# Patient Record
Sex: Male | Born: 1970 | Race: White | Hispanic: No | Marital: Married | Smoking: Never smoker
Health system: Southern US, Academic
[De-identification: ages and names within clinical notes are randomized; demographics above are authoritative.]

## PROBLEM LIST (undated history)

## (undated) DIAGNOSIS — I10 Essential (primary) hypertension: Secondary | ICD-10-CM

## (undated) DIAGNOSIS — C801 Malignant (primary) neoplasm, unspecified: Secondary | ICD-10-CM

## (undated) DIAGNOSIS — K219 Gastro-esophageal reflux disease without esophagitis: Secondary | ICD-10-CM

## (undated) DIAGNOSIS — R011 Cardiac murmur, unspecified: Secondary | ICD-10-CM

## (undated) DIAGNOSIS — Z973 Presence of spectacles and contact lenses: Secondary | ICD-10-CM

## (undated) DIAGNOSIS — E785 Hyperlipidemia, unspecified: Secondary | ICD-10-CM

## (undated) HISTORY — PX: SKIN CANCER EXCISION: SHX779

## (undated) HISTORY — PX: GALLBLADDER SURGERY: SHX652

## (undated) HISTORY — PX: HX APPENDECTOMY: SHX54

## (undated) HISTORY — PX: HX UPPER ENDOSCOPY: 2100001144

## (undated) HISTORY — PX: HX WISDOM TEETH EXTRACTION: SHX21

---

## 2021-04-30 ENCOUNTER — Ambulatory Visit (INDEPENDENT_AMBULATORY_CARE_PROVIDER_SITE_OTHER): Payer: Self-pay | Admitting: Foot Surgery

## 2021-05-05 ENCOUNTER — Other Ambulatory Visit (INDEPENDENT_AMBULATORY_CARE_PROVIDER_SITE_OTHER): Payer: 59 | Admitting: Radiology

## 2021-05-05 ENCOUNTER — Ambulatory Visit (INDEPENDENT_AMBULATORY_CARE_PROVIDER_SITE_OTHER): Payer: 59 | Admitting: Foot Surgery

## 2021-05-05 ENCOUNTER — Encounter (INDEPENDENT_AMBULATORY_CARE_PROVIDER_SITE_OTHER): Payer: Self-pay | Admitting: Foot Surgery

## 2021-05-05 ENCOUNTER — Other Ambulatory Visit: Payer: Self-pay

## 2021-05-05 ENCOUNTER — Encounter (INDEPENDENT_AMBULATORY_CARE_PROVIDER_SITE_OTHER): Payer: Self-pay

## 2021-05-05 VITALS — Temp 96.8°F | Ht 72.0 in | Wt 320.1 lb

## 2021-05-05 DIAGNOSIS — S86312A Strain of muscle(s) and tendon(s) of peroneal muscle group at lower leg level, left leg, initial encounter: Secondary | ICD-10-CM

## 2021-05-05 DIAGNOSIS — L600 Ingrowing nail: Secondary | ICD-10-CM

## 2021-05-05 DIAGNOSIS — S86311D Strain of muscle(s) and tendon(s) of peroneal muscle group at lower leg level, right leg, subsequent encounter: Secondary | ICD-10-CM

## 2021-05-05 DIAGNOSIS — M2141 Flat foot [pes planus] (acquired), right foot: Secondary | ICD-10-CM

## 2021-05-05 DIAGNOSIS — M2142 Flat foot [pes planus] (acquired), left foot: Secondary | ICD-10-CM

## 2021-05-05 DIAGNOSIS — L03031 Cellulitis of right toe: Secondary | ICD-10-CM

## 2021-05-05 DIAGNOSIS — M79672 Pain in left foot: Secondary | ICD-10-CM

## 2021-05-05 DIAGNOSIS — G8929 Other chronic pain: Secondary | ICD-10-CM

## 2021-05-05 DIAGNOSIS — M25572 Pain in left ankle and joints of left foot: Secondary | ICD-10-CM

## 2021-05-05 DIAGNOSIS — M7661 Achilles tendinitis, right leg: Secondary | ICD-10-CM

## 2021-05-05 DIAGNOSIS — M7732 Calcaneal spur, left foot: Secondary | ICD-10-CM

## 2021-05-05 DIAGNOSIS — M25571 Pain in right ankle and joints of right foot: Secondary | ICD-10-CM

## 2021-05-05 DIAGNOSIS — M79671 Pain in right foot: Secondary | ICD-10-CM

## 2021-05-05 NOTE — H&P (Cosign Needed)
Eric Cannon   Department of [facility]  Podiatry    Chief Complaint   Patient presents with    Foot Pain           Past Medical History:   History reviewed. No pertinent past medical history.          Family History:  Family Medical History:    None           Social History:     Social History     Tobacco Use    Smoking status: Never     Passive exposure: Never    Smokeless tobacco: Former     Types: Snuff   Substance Use Topics    Alcohol use: Not on file     Comment: 2 drinks in the last 3 months       Medications:    atorvastatin (LIPITOR) 20 mg Oral Tablet, Take 1 Tablet (20 mg total) by mouth Every night  cetirizine HCl (CETIRIZINE ORAL), Take 10 mg by mouth  hydroCHLOROthiazide (HYDRODIURIL) 25 mg Oral Tablet, Take 1 Tablet (25 mg total) by mouth Once a day  omega 3-dha-epa-fish oil 900 mg-360 mg- 455 mg-1,000 mg Oral Capsule, Take by mouth  omeprazole (PRILOSEC) 40 mg Oral Capsule, Delayed Release(E.C.), Take 1 Capsule (40 mg total) by mouth Once a day    No facility-administered medications prior to visit.      Allergies:     Allergies   Allergen Reactions    Asprin Ec Low Dose [Aspirin] Swelling       There is no problem list on file for this patient.      DPM Addition to HPI: Eric Cannon is a 51 y.o. male who presents to clinic for Foot Pain. Has complaint of pain in both ankles. Mentions a knot on the lateral right ankle as well as both ankles rolling easily. Had MRI's done on both ankles in [place]. 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 He wears braces with steel in them for his ankles and power step insoles. Reports he was told he needs surgery for torn ligaments and spurs. Also has complaint of ingrown right hallux nail.  He was told that he had tears and some ligaments.      Physical Exam:  Temp 36 C (96.8 F) (Thermal Scan)    Ht 1.829 m (6')    Wt (!) 145 kg (320 lb 1.6 oz)    BMI 43.41 kg/m       General exam:   Alert orient x3 well dressed well-groomed well-nourished.  Vascular exam:  Palpable DP PT pulses bilaterally, capillary return is less than 5 seconds digits 1 through 5 bilaterally.  Positive hair growth.  Derm exam:  Normal texture turgor color. Incurvated fibular border right great toenail.   Neuro exam:  Light touch sharp dull sensation deep tendon reflexes intact.  Ortho exam:  Pain-free range of motion AJ STJ MPJs muscle test is 5 5 extensor flexor invertor everters. Palpable bony nodule lateral malleolus right foot. Pain to palpation Peroneal tendon bilaterally posterior to malleolus.     Radiographs:  05/05/21 XR FEET WT BEARING BL plantar and posterior calcaneal spur noted on left calcaneus on the lateral views.  There is some cystic changes of the calcaneus on the lateral view on the right foot.  There is also os perineum noted bilaterally.  There is accessory ossicle neck of the  talus on the right.  Pes planus deformity noted bilaterally.  05/05/21 XR ANKLES BL WT BEARING   Similar findings as the foot films are noted    Assessment:    ICD-10-CM    1. Bilateral foot pain  M79.671 XR FEET WT BEARING BILATERAL    M79.672       2. Ingrown nail of great toe of right foot  L60.0 11750 - EXCISION OF NAIL/NAIL MATRIX-PARTIAL/COMPLETE (AMB ONLY-PD)      3. Cellulitis of great toe, right  L03.031       4. Chronic pain of both ankles  M25.571 XR ANKLES BILAT WEIGHT BEARING    G89.29 Refer to Orthopaedics POC    M25.572       5. Tear of peroneal tendon of left foot  S86.312A Refer to Orthopaedics POC      6. Peroneal tendon tear, right, subsequent encounter  S86.311D Refer to [place] of Care:  1. Bilateral radiographs taken of feet and ankles. These were reviewed with the patient.   2. Discussed permanent nail procedure with the patient in great detail.  They are fully aware there is never a 100% guarantee as surgical outcome.  I did advise about possible complications to include but not  limited to; delayed healing, non-healing, infection, recurrence, and loss of nail.  They wished to proceed with the procedure.  1st digit of the right foot was anesthetized with 3 cc of 1% lidocaine with epi 1:100,000 in the for of a digital block.  The digit was prepped in the usual sterile manner.  Digital tourniquet was applied after the digit had been exsanguinated with coban.  The fibular nail border/s were removed en toto 4 applications of phenol were applied to the proximal nail fold/s for 30 secs each.  Site was flushed with alcohol solution.  Dressing was applied using antibiotic ointment, 2x2, 4x4, and 1" coban under slight compression.  Capillary return was noted to the digit.  Patient was given both oral and written post operative instructions.  They tolerated the procedure well and left the clinic with vital stable and neurovascular status intact.  3. Patient will be referred to [place] for further evaluation of ankle pain and peroneal tendon tear.    Follow up:  Return in about 2 weeks (around 05/19/2021) for P & A Nail.  I have asked MCLAIN FREER to follow up in 2 weeks or PRN if symptoms worsen or fail to improve.     I am scribing for, and in the presence of, Dr. Clarise Cruz for services provided on 05/05/2021.  Jovita Kussmaul, SCRIBE   [place], [place]  05/05/2021, 09:47      I personally performed the services described in this documentation, as scribed  in my presence, and it is both accurate  and complete.    Clarise Cruz, DPM    Clarise Cruz, DPM  05/20/2021, 09:53      Orders Placed This Encounter    11750 - EXCISION OF NAIL/NAIL MATRIX-PARTIAL/COMPLETE (AMB ONLY-PD)    XR FEET WT BEARING BILATERAL    XR ANKLES BILAT WEIGHT BEARING

## 2021-05-05 NOTE — Patient Instructions (Signed)
[facility]  [address]  [place], [place] [address]  Podiatry Department: [phone]  Fax: [phone]    Dr. Cherie Kelly-Danhires, DPM  Dr. Kathryn Bosia, DPM    Post-op Nail Instructions for Nail Removal Procedures      Post -Operative Instructions for Nail Removal Procedures    . Leave bandage on and keep dry overnight; it is normal for blood to be on the bandage when it is removed.    . Begin to soak foot in Epsom salts the following day, twice a day for 20 minutes.    . Apply antibiotic ointment and a band aid during the day; allow to air dry at night.    . Expect the toe to become a little red and tender where the nail was removed.  Some drainage will occur which is normal and necessary for healing.    . Watch for signs of infection; pain, redness, swelling, pus, red streaking up you foot and/or leg, fever or chills.  If you should develop any of the above, call the Podiatry Clinic at [phone].    . Your toe will be numb for approximately 4 to 5 hours.  Begin taking extra strength Tylenol or Motrin as directed on manufactuers bottle.    . Resume normal activities as tolerated.    . Follow up with your podiatrist in 2 weeks.

## 2021-05-20 NOTE — Procedures (Signed)
[facility]  [place] [address]  [facility]  Procedure Note    Name: Eric Cannon MRN:  [id number]   Date: 05/05/2021 Age: 51 y.o.     See plan of H&P  Clarise Cruz, DPM  05/20/2021, 09:46

## 2021-05-26 ENCOUNTER — Other Ambulatory Visit: Payer: Self-pay

## 2021-05-26 ENCOUNTER — Ambulatory Visit (INDEPENDENT_AMBULATORY_CARE_PROVIDER_SITE_OTHER): Payer: 59 | Admitting: Foot Surgery

## 2021-05-26 ENCOUNTER — Encounter (INDEPENDENT_AMBULATORY_CARE_PROVIDER_SITE_OTHER): Payer: Self-pay | Admitting: Foot Surgery

## 2021-05-26 VITALS — Temp 97.0°F | Ht 72.0 in | Wt 319.7 lb

## 2021-05-26 DIAGNOSIS — S86312A Strain of muscle(s) and tendon(s) of peroneal muscle group at lower leg level, left leg, initial encounter: Secondary | ICD-10-CM

## 2021-05-26 DIAGNOSIS — L600 Ingrowing nail: Secondary | ICD-10-CM

## 2021-05-26 DIAGNOSIS — M25571 Pain in right ankle and joints of right foot: Secondary | ICD-10-CM

## 2021-05-26 DIAGNOSIS — S86311D Strain of muscle(s) and tendon(s) of peroneal muscle group at lower leg level, right leg, subsequent encounter: Secondary | ICD-10-CM

## 2021-05-26 DIAGNOSIS — G8929 Other chronic pain: Secondary | ICD-10-CM

## 2021-05-26 DIAGNOSIS — M25572 Pain in left ankle and joints of left foot: Secondary | ICD-10-CM

## 2021-05-26 NOTE — Progress Notes (Signed)
[place]   Department of Orthopaedics  [facility]  Podiatry    Subjective:     Patient presents approximately 20 days status post partial phenol matrixectomy of the fibular border/s of the 1st digit right foot.   They deny any cold chills fever night sweats.  They state they have no pain in the toe.  They have been compliant with postoperative treatment.    Objective:  The  digit appears to be healing.  There is some pain to palpation.  There is some periungual erythema.  There are no signs of infection negative Edema negative malodor negative purulent drainage.    Assessment:  Well-healing status post nail procedure with no postop infection.    ICD-10-CM    1. Ingrown nail of great toe of right foot  L60.0       2. Chronic pain of both ankles  M25.571     G89.29     M25.572       3. Tear of peroneal tendon of left foot  S86.312A       4. Peroneal tendon tear, right, subsequent encounter  S86.311D           Plan:  1. Patient will continue with once daily soaks.  They made the bandage off at night to allow this to get air.  This she continue scrubbing area with warm soapy water.  Should continue Band-Aid when in shoe gear as long as there is drainage on the bandage.    2. They will contact us should they have any questions or concerns regarding the healing of this.  Did advise him to allow the scab to form and fall off on its own.  3. Patient has an appointment for his ankle May 16th with Dr. Iris Pert.    Return if symptoms worsen or fail to improve.    I am scribing for, and in the presence of, Dr. Clarise Cruz for services provided on 05/26/2021.  Jovita Kussmaul, SCRIBE   [place], [place]  05/26/2021, 14:42    I personally performed the services described in this documentation, as scribed  in my presence, and it is both accurate  and complete.    Clarise Cruz, [place] Antigone Crowell, [place]  05/26/2021, 15:04

## 2021-07-06 ENCOUNTER — Other Ambulatory Visit: Payer: Self-pay

## 2021-07-06 ENCOUNTER — Ambulatory Visit: Payer: 59 | Attending: Orthopaedic Surgery | Admitting: Orthopaedic Surgery

## 2021-07-06 VITALS — BP 128/82 | HR 81 | Temp 97.3°F | Ht 72.0 in | Wt 320.5 lb

## 2021-07-06 DIAGNOSIS — S86319A Strain of muscle(s) and tendon(s) of peroneal muscle group at lower leg level, unspecified leg, initial encounter: Secondary | ICD-10-CM | POA: Insufficient documentation

## 2021-07-06 DIAGNOSIS — M7670 Peroneal tendinitis, unspecified leg: Secondary | ICD-10-CM | POA: Insufficient documentation

## 2021-07-06 DIAGNOSIS — M7671 Peroneal tendinitis, right leg: Secondary | ICD-10-CM

## 2021-07-06 DIAGNOSIS — M7672 Peroneal tendinitis, left leg: Secondary | ICD-10-CM

## 2021-07-06 DIAGNOSIS — S86311A Strain of muscle(s) and tendon(s) of peroneal muscle group at lower leg level, right leg, initial encounter: Secondary | ICD-10-CM

## 2021-07-06 DIAGNOSIS — G5780 Other specified mononeuropathies of unspecified lower limb: Secondary | ICD-10-CM | POA: Insufficient documentation

## 2021-07-06 DIAGNOSIS — Q663 Other congenital varus deformities of feet, unspecified foot: Secondary | ICD-10-CM | POA: Insufficient documentation

## 2021-07-06 DIAGNOSIS — S86312A Strain of muscle(s) and tendon(s) of peroneal muscle group at lower leg level, left leg, initial encounter: Secondary | ICD-10-CM

## 2021-07-06 NOTE — Progress Notes (Signed)
[facility]  [place], [place] [address]  O: [phone]  F: [phone]       [facility]     PATIENT NAME:  Eric Cannon RECORD NUMBER: [id number]    Chief Complaint      bilateral ankle pain    History of Present Illness      Eric Cannon is a very pleasant 51 y.o. male who presents today as a new patient for bilateral ankle complaints.  The symptoms started over 1 yr ago.  He reports no new injury or accident.  Associated symptoms include pain, weakness and instability.  The symptoms/pain are predominantly arising from the lateral retromalleolar area.  The symptoms/pain are described as dull, more intense with daily activities, with walking and with standing, and are perceived as intermittent. The symptoms are aggravated by weight bearing, standing and walking.  He has tried activity modification, NSAIDS and custom bracing without much relief.  The patient admits to frequent feelings of ankle instability.    He denies any prior relevant foot/ankle surgeries in the past.    He works as a [employer] administrator. He has tried PT, OTC meds, immobilization, bracing, time    Patient describes retromalleolar ankle pain as the most severe spot as well as anterolateral ankle pain. He states he was given [place] braces that help mildly. He cannot walk on uneven surfaces without these.       Past Medical/Surgical History     No past medical history on file.         Past Surgical History:   Procedure Laterality Date   . GALLBLADDER SURGERY     . HX APPENDECTOMY                Past Family and Social History     Family Medical History:    None           Social History     Socioeconomic History   . Marital status: Married     Spouse name: Not on file   . Number of children: Not on file   . Years of education: Not on file   . Highest education level: Not on file   Occupational History    . Not on file   Tobacco Use   . Smoking status: Never     Passive exposure: Never   . Smokeless tobacco: Former     Types: Snuff   Substance and Sexual Activity   . Alcohol use: Not on file     Comment: 2 drinks in the last 3 months   . Drug use: Never   . Sexual activity: Not on file   Other Topics Concern   . Not on file   Social History Narrative   . Not on file     Social Determinants of Health     Financial Resource Strain: Not on file   Transportation Needs: Not on file   Social Connections: Not on file   Intimate Partner Violence: Not on file   Housing Stability: Not on file        Medications and Allergies     Home Medications:  Current Outpatient Medications   Medication Instructions   . atorvastatin (LIPITOR) 20 mg, Oral, NIGHTLY   . cetirizine HCl (CETIRIZINE ORAL) 10 mg, Oral   . glucosam-chon-msm1-C-mang (OSTEO  BI-FLEX TRIPLE STRENGTH) 750 mg-644 mg- 30 mg-1 mg Oral Tablet 2 Tablets, Oral, DAILY   . hydroCHLOROthiazide (HYDRODIURIL) 25 mg, Oral, DAILY   . multivit with minerals/lutein (MULTIVITAMIN 50 PLUS ORAL) Oral   . omega 3-dha-epa-fish oil 900 mg-360 mg- 455 mg-1,000 mg Oral Capsule Oral   . omeprazole (PRILOSEC) 40 mg, Oral, DAILY       Allergies:  Allergies   Allergen Reactions   . Asprin Ec Low Dose [Aspirin] Swelling       Review of Systems     A 14-point comprehensive ROS is obtained from the patient today by my CMA and personally reviewed by myself.  It is documented in a separate note which includes the pertinent positives.  All other systems are negative.    This ROS includes the following systems: Constitutional, HEENT, Eyes, Respiratory, Cardiovascular, GastrointestinaI, Endocrine, Genitourinary, Musculoskeletal, Allergy/Immuno, Neurological, Hematologic, Behavorial, and Skin.      Physical Examination + Relevant Imaging/Labs/Other Studies     Ht:1.829 m (6') Wt:(!) 145 kg (320 lb 8.8 oz) BMI: Body mass index is 43.47 kg/m.    General/Constitutional: no apparent distress,  well-nourished, well-developed  Eyes: pupils are equal, round, with synchronous movement; sclera white  Lymphatic: no palpable adenopathy  Respiratory: non-labored breathing, symmetric rise of chest wall  Vascular: no edema, cyanosis, varicosities, or swelling, except as noted in exam  Integumentary: no impressive skin lesions present; skin has normal pigmentation, color, hair distribution, texture, and temperature except as noted in exam  Neurological:  Oriented to person, place, and time.  Psychological:  Normal mood and affect.  Musculoskeletal: normal, except as noted in exam      Foot/Ankle exam       RIGHT LEFT   GAIT: Normal Normal        STANDING ALIGNMENT:     Ankle/Hindfoot/Midfoot Deformity Varus alignment hindfoot Varus alignment hindfoot   Forefoot Deformity none none        SEATED EXAM:     Skin normal normal   Nails normal- no clubbing or nail changes normal- no clubbing or nail changes   Effusion none none   Swelling none none   Warmth No warmth No warmth   Crepitation none none   Tenderness Ankle - tender over peroneal tendons, ATFL  Foot - none  Toes - none Ankle - tender over peroneal tendons, ATFL  Foot - none  Toes - none        ROM:     Ankle Dorsiflexion 20 degrees, Painless 20 degrees, Painless   Ankle Plantarflexion 40 degrees, Painless 40 degrees, Painless   Subtalar Joint Inversion 30 degrees, Painless 30 degrees, Painless   Subtalar Joint Eversion 15 degrees, painful 15 degrees, painful   Transverse Tarsal Joints Abduction Painless Painless   Transverse Tarsal Joints Adduction Painless Painless        STRENGTH:     Atrophy none none   Abnormal Movements none none   Anterior Tibial Muscle 5/5 5/5   Posterior Tibial Muscle 5/5 5/5   Peroneals 5/5 5/5   Gastroc-soleus Muscle 5/5 5/5   Single Heel Raise Able Able        STABILITY: No instability No instability        SENSATION:     Sural Nerve Dist. Paresthesias  intact   Saphenous New Dist. intact intact   Deep Peroneal Nerve Dist. intact  intact   Superficial Peroneal Nerve Dist. intact intact        VASCULAR EXAM:  Varicosities none none   Capillary refill < 3 seconds/normal < 3 seconds/normal   DP Artery pulse Palpable Palpable   PT Artery pulse Palpable Palpable        Equinus contracture present? no no   Silfverskiold testing is Silfverskiold testing: negative for any significant contracture Silfverskiold testing: negative for any significant contracture          OTHER TESTS/FINDINGS:   RIGHT LEFT   Anterior ankle drawer (in PF): negative negative   Anterior ankle drawer (in neutral DF):  negative negative   Anteromedial (deltoid) ankle drawer test:  negative negative   Tenderness to palpation over ankle syndesmosis?  no no   Anterior ankle impingement test:  Negative Negative   Posterior ankle impingement test:  Negative Negative   Edema/fullness over peroneal tendons?  yes yes   Peroneal tenderness to palpation or with resistive eversion testing?   yes yes   Peroneal subluxation or dislocation with provacative testing?  no no   Gastroc/calf atrophy?  no no   Achilles tenderness/defect? no no   Edema and tenderness over course of posterior tibial tendon?{ no no   Tenderness over posterior tibial tendon with resisted inversion testing? no no   Able to perform double heel rise on affected side?  yes yes   Able to perform single heel rise on affected side?  yes yes   Discomfort with attempted heel rise on affected side?  no no   Tinel's over the tarsal tunnel region?  no no         Imaging   Radiographs:  3V WBof the bilateralfoot and ankle are ordered, obtained, and independently reviewed/interpreted by myself today and reveal some anterior ankle osteophytes. He also has hindfoot varus       Other Studies, Imaging, Lab Testing   MRI of bilateral ankles from outside show bilateral peroneal tendon edema around the tendons sheaths. The right side has a lot of edema around the peroneal tubercle. The reads state bilateral brevis tears with  tendonopathy of the longus tendons      Assessment and Plan     Assessment/Plan   1. Peroneal tendinitis, unspecified laterality    2. Peroneal tendon tear    3. Varus deformity of foot    4. 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    1. Patient has failed multiple months of conservative treatment. His symptoms are severe and is interested in surgery. He is a reasonable surgical candidate  2. Surgery would include debridement of the right side peroneal tendons with either tubularization if the tendon can be repaired. He may end up with an allograft or tenodesis depending on amount of tendon involvement. We would start with the right side only  3. We turn in an OR card today. He will need PCP clearance. He is allergic to ASA so we will need to determine what to put him on afterwards    - He will follow up: for preop H&P (We will do consent today)    Questions and concerns have been invited/addressed and the patient has a good understanding of the diagnosis, treatment plan, goals/expectations, and overall prognosis.      --  Princella Ion, MD  Resident, PGY-5  Department of Orthopaedics  Pager [phone]  07/06/2021, 10:28    I saw and examined the patient.  I directly supervised the resident's activities and procedures.  I  reviewed the resident's note.  I agree with the findings and plan of care as documented in the resident's note.  Radiographs and MRI:  Images reviewed by me on the day of the encounter revealed bilateral peroneal brevis tendinopathy with possible tear of the peroneus brevis and increased edema around the peroneal tubercle on the right.    My findings are consistent with:  Assessment/Plan   1. Peroneal tendinitis, unspecified laterality    2. Peroneal tendon tear    3. Varus deformity of foot    4. Sural neuritis        Assessment and plan:   Eric Cannon is a very pleasant 51 year old male patient presents to clinic today for evaluation  of bilateral ankle pain, worse on the right.  The patient had extensive conservative treatment including physical therapy over the past year without improvement in his symptoms.  The symptoms are worse on the right.  We discussed the diagnosis of peroneal tendon tear, tendinopathy, hindfoot varus.  The patient has some numbness shooting down the lateral aspect of his foot we discussed the diagnosis of sural neuritis.  I went over the conservative treatment options for peroneal tendinitis/tear, I have recommended the use of Voltaren gel.  The patient feels exhausted from doing conservative treatment for over a year with no improvement and worsening of his symptoms.  We discussed the surgical treatment option that includes peroneal tendon exploration, debridement, possible tear, possible allograft replacement and excision of the peroneal tubercle.  We discussed the postoperative recovery protocol.  We also went over the benefits and the risks of the surgery in details.  The patient is allergic to aspirin.  We discussed the risk of DVT after peroneal tendon surgery patient is interested in pursuing surgery.    I personally discussed the diagnosis, alternative treatment options (and the risks/benefits of such), risks and benefits of the above surgical intervention, and all questions/concerns were invited and addressed with the patient/family.  We further reviewed the associated risks and benefits of electing to pursue none of the non-operative or operative treatments.  The patient/family has verbalized a good understanding of the diagnosis, the planned procedure as above, anticipated post-operative course (which has also been documented on my post-operative worksheet that I review with and give to each patient), overall prognosis, benefits to surgery, and risks, that include, but are not limited to: infection, wound healing issues, stiffness, persisting or worsening pain, numbness, nerve and soft tissue injury, surgery  risks: need for further surgery, failure of procedure, complications of anesthesia, stroke, blood clot, major cardiopulmonary event, and loss of limb or life.  The patient wishes to proceed with surgical intervention as described by myself and, as such, both the patient legal guardian: patient and I signed an informed surgical consent.      Arlyss Repress, MD  Assistant Professor-   Foot & Ankle Surgery  Department of [place] [facility]

## 2021-08-03 ENCOUNTER — Encounter (HOSPITAL_BASED_OUTPATIENT_CLINIC_OR_DEPARTMENT_OTHER): Payer: Self-pay

## 2021-08-05 ENCOUNTER — Encounter (INDEPENDENT_AMBULATORY_CARE_PROVIDER_SITE_OTHER): Payer: Self-pay | Admitting: Orthopaedic Surgery

## 2021-08-05 ENCOUNTER — Other Ambulatory Visit: Payer: Self-pay

## 2021-08-05 ENCOUNTER — Inpatient Hospital Stay (HOSPITAL_COMMUNITY)
Admission: RE | Admit: 2021-08-05 | Discharge: 2021-08-05 | Disposition: A | Discharge status: Home / Self Care | Payer: 59 | Source: Ambulatory Visit | Attending: Orthopaedic Surgery | Admitting: Orthopaedic Surgery

## 2021-08-05 ENCOUNTER — Ambulatory Visit: Payer: 59 | Attending: Orthopaedic Surgery | Admitting: Orthopaedic Surgery

## 2021-08-05 ENCOUNTER — Encounter (HOSPITAL_COMMUNITY): Payer: Self-pay

## 2021-08-05 VITALS — BP 142/90 | HR 69 | Temp 97.1°F | Ht 73.03 in | Wt 310.8 lb

## 2021-08-05 DIAGNOSIS — M7671 Peroneal tendinitis, right leg: Secondary | ICD-10-CM

## 2021-08-05 DIAGNOSIS — M7670 Peroneal tendinitis, unspecified leg: Secondary | ICD-10-CM | POA: Insufficient documentation

## 2021-08-05 DIAGNOSIS — Z01818 Encounter for other preprocedural examination: Secondary | ICD-10-CM

## 2021-08-05 HISTORY — DX: Gastro-esophageal reflux disease without esophagitis: K21.9

## 2021-08-05 HISTORY — DX: Presence of spectacles and contact lenses: Z97.3

## 2021-08-05 HISTORY — DX: Malignant (primary) neoplasm, unspecified (CMS HCC): C80.1

## 2021-08-05 HISTORY — DX: Essential (primary) hypertension: I10

## 2021-08-05 HISTORY — DX: Hyperlipidemia, unspecified: E78.5

## 2021-08-05 HISTORY — DX: Cardiac murmur, unspecified: R01.1

## 2021-08-05 LAB — POC BLOOD GLUCOSE (RESULTS): GLUCOSE, POC: 100 mg/dl (ref 70–105)

## 2021-08-05 NOTE — Progress Notes (Signed)
[place]  [place], [place] [id number]  O: [phone]  F: [phone]       [facility]     PATIENT NAME:  Eric Cannon RECORD NUMBER: [id number]    Chief Complaint      bilateral ankle pain    History of Present Illness      Eric Cannon is a very pleasant 51 y.o. male who presents today as a new patient for bilateral ankle complaints.  The symptoms started over 1 yr ago.  He reports no new injury or accident.  Associated symptoms include pain, weakness and instability.  The symptoms/pain are predominantly arising from the lateral retromalleolar area.  The symptoms/pain are described as dull, more intense with daily activities, with walking and with standing, and are perceived as intermittent. The symptoms are aggravated by weight bearing, standing and walking.  He has tried activity modification, NSAIDS and custom bracing without much relief.  The patient admits to frequent feelings of ankle instability.    He denies any prior relevant foot/ankle surgeries in the past.    He works as a Arts administrator. He has tried PT, OTC meds, immobilization, bracing, time    Patient describes retromalleolar ankle pain as the most severe spot as well as anterolateral ankle pain. He states he was given [place] braces that help mildly. He cannot walk on uneven surfaces without these.     08/05/21  Patient presents today for his preop H&P. His symptoms remain the same with pain in the lateral retro-malleolar area. He is very painful over his peroneal tubercle. He wants to proceed forward with surgery. He did obtain clearance from his PCP.      Past Medical/Surgical History     No past medical history on file.         Past Surgical History:   Procedure Laterality Date   . GALLBLADDER SURGERY     . HX APPENDECTOMY                Past Family and Social History     Family Medical  History:    None           Social History     Socioeconomic History   . Marital status: Married     Spouse name: Not on file   . Number of children: Not on file   . Years of education: Not on file   . Highest education level: Not on file   Occupational History   . Not on file   Tobacco Use   . Smoking status: Never     Passive exposure: Never   . Smokeless tobacco: Former     Types: Snuff   Substance and Sexual Activity   . Alcohol use: Not on file     Comment: 2 drinks in the last 3 months   . Drug use: Never   . Sexual activity: Not on file   Other Topics Concern   . Not on file   Social History Narrative   . Not on file     Social Determinants of Health     Financial Resource Strain: Not on file   Transportation Needs: Not on file   Social Connections: Not on file   Intimate Partner Violence: Not on file   Housing Stability: Not on file  Medications and Allergies     Home Medications:  Current Outpatient Medications   Medication Instructions   . atorvastatin (LIPITOR) 20 mg, Oral, NIGHTLY   . cetirizine HCl (CETIRIZINE ORAL) 10 mg, Oral   . glucosam-chon-msm1-C-mang (OSTEO BI-FLEX TRIPLE STRENGTH) 750 mg-644 mg- 30 mg-1 mg Oral Tablet 2 Tablets, Oral, DAILY   . hydroCHLOROthiazide (HYDRODIURIL) 25 mg, Oral, DAILY   . multivit with minerals/lutein (MULTIVITAMIN 50 PLUS ORAL) Oral   . omega 3-dha-epa-fish oil 900 mg-360 mg- 455 mg-1,000 mg Oral Capsule Oral   . omeprazole (PRILOSEC) 40 mg, Oral, DAILY       Allergies:  Allergies   Allergen Reactions   . Asprin Ec Low Dose [Aspirin] Swelling       Review of Systems     A 14-point comprehensive ROS is obtained from the patient today by my CMA and personally reviewed by myself.  It is documented in a separate note which includes the pertinent positives.  All other systems are negative.    This ROS includes the following systems: Constitutional, HEENT, Eyes, Respiratory, Cardiovascular, GastrointestinaI, Endocrine, Genitourinary, Musculoskeletal, Allergy/Immuno,  Neurological, Hematologic, Behavorial, and Skin.      Physical Examination + Relevant Imaging/Labs/Other Studies     Ht:1.855 m (6' 1.03") Wt:(!) 141 kg (310 lb 13.6 oz) BMI: Body mass index is 40.98 kg/m.    General/Constitutional: no apparent distress, well-nourished, well-developed  Eyes: pupils are equal, round, with synchronous movement; sclera white  Lymphatic: no palpable adenopathy  Respiratory: non-labored breathing, symmetric rise of chest wall  Vascular: no edema, cyanosis, varicosities, or swelling, except as noted in exam  Integumentary: no impressive skin lesions present; skin has normal pigmentation, color, hair distribution, texture, and temperature except as noted in exam  Neurological:  Oriented to person, place, and time.  Psychological:  Normal mood and affect.  Musculoskeletal: normal, except as noted in exam      Foot/Ankle exam       RIGHT LEFT   GAIT: Normal Normal        STANDING ALIGNMENT:     Ankle/Hindfoot/Midfoot Deformity Varus alignment hindfoot Varus alignment hindfoot   Forefoot Deformity none none        SEATED EXAM:     Skin normal normal   Nails normal- no clubbing or nail changes normal- no clubbing or nail changes   Effusion none none   Swelling none none   Warmth No warmth No warmth   Crepitation none none   Tenderness Ankle - tender over peroneal tendons, ATFL  Foot - none  Toes - none Ankle - tender over peroneal tendons, ATFL  Foot - none  Toes - none        ROM:     Ankle Dorsiflexion 20 degrees, Painless 20 degrees, Painless   Ankle Plantarflexion 40 degrees, Painless 40 degrees, Painless   Subtalar Joint Inversion 30 degrees, Painless 30 degrees, Painless   Subtalar Joint Eversion 15 degrees, painful 15 degrees, painful   Transverse Tarsal Joints Abduction Painless Painless   Transverse Tarsal Joints Adduction Painless Painless        STRENGTH:     Atrophy none none   Abnormal Movements none none   Anterior Tibial Muscle 5/5 5/5   Posterior Tibial Muscle 5/5 5/5    Peroneals 5/5 5/5   Gastroc-soleus Muscle 5/5 5/5   Single Heel Raise Able Able        STABILITY: No instability No instability        SENSATION:  Sural Nerve Dist. Paresthesias  intact   Saphenous New Dist. intact intact   Deep Peroneal Nerve Dist. intact intact   Superficial Peroneal Nerve Dist. intact intact        VASCULAR EXAM:     Varicosities none none   Capillary refill < 3 seconds/normal < 3 seconds/normal   DP Artery pulse Palpable Palpable   PT Artery pulse Palpable Palpable        Equinus contracture present? no no   Silfverskiold testing is Silfverskiold testing: negative for any significant contracture Silfverskiold testing: negative for any significant contracture          OTHER TESTS/FINDINGS:   RIGHT LEFT   Anterior ankle drawer (in PF): negative negative   Anterior ankle drawer (in neutral DF):  negative negative   Anteromedial (deltoid) ankle drawer test:  negative negative   Tenderness to palpation over ankle syndesmosis?  no no   Anterior ankle impingement test:  Negative Negative   Posterior ankle impingement test:  Negative Negative   Edema/fullness over peroneal tendons?  yes yes   Peroneal tenderness to palpation or with resistive eversion testing?   yes yes   Peroneal subluxation or dislocation with provacative testing?  no no   Gastroc/calf atrophy?  no no   Achilles tenderness/defect? no no   Edema and tenderness over course of posterior tibial tendon?{ no no   Tenderness over posterior tibial tendon with resisted inversion testing? no no   Able to perform double heel rise on affected side?  yes yes   Able to perform single heel rise on affected side?  yes yes   Discomfort with attempted heel rise on affected side?  no no   Tinel's over the tarsal tunnel region?  no no         Imaging   Radiographs:  3V WBof the bilateralfoot and ankle are ordered, obtained, and independently reviewed/interpreted by myself today and reveal some anterior ankle osteophytes. He also has hindfoot varus        Other Studies, Imaging, Lab Testing   MRI of bilateral ankles from outside show bilateral peroneal tendon edema around the tendons sheaths. The right side has a lot of edema around the peroneal tubercle. The reads state bilateral brevis tears with tendonopathy of the longus tendons      Assessment and Plan     Assessment/Plan   1. 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    We consent him today for Right foot peroneal tendon reconstructions vs debridement, possible allograft, excision of peroneal tubercle, possible groove deepening, and all other procedures as indicated    We go over risks and benefits of surgery today: risks not limited to bleeding, infection, damage to nearby structures, and need for more surgery    Patient will be put on Eliquis after surgery due to his ASA allergery    - He will follow up: for surgery    Questions and concerns have been invited/addressed and the patient has a good understanding of the diagnosis, treatment plan, goals/expectations, and overall prognosis.    --  Princella Ion, MD  Resident, PGY-5  Department of Orthopaedics  Pager [phone]  08/05/2021, 13:23    I saw and examined the patient.  I directly supervised the resident's activities and procedures.  I reviewed the resident's note.  I agree with the findings and plan of care as documented  in the resident's note.    My findings are consistent with:  Assessment/Plan   1. Peroneal tendinitis, unspecified laterality        Assessment and plan:   Mr. Shaul is a very pleasant 51 year old male patient presents to clinic today for preoperative assessment.  We again discussed the diagnosis of peroneal tendon tear with prominent and tender peroneal tubercle.  We discussed the surgical intervention in details.  This will include peroneal tendon exploration, possible repair, possible debridement, possible allograft and excision of the  peroneal tubercle.   We discussed the postoperative recovery protocol..  We specifically discussed the varus heel alignment, however this point we will address the peroneal tendons pathology and avoid calcaneal osteotomy. Patient is agreeable to this plan. The patient will be at least 2 weeks non-weight bearing.  Patient is allergic to aspirin.  He will be placed on Eliquis after the surgery.    I personally discussed the diagnosis, alternative treatment options (and the risks/benefits of such), risks and benefits of the above surgical intervention, and all questions/concerns were invited and addressed with the patient/family.  We further reviewed the associated risks and benefits of electing to pursue none of the non-operative or operative treatments.  The patient/family has verbalized a good understanding of the diagnosis, the planned procedure as above, anticipated post-operative course (which has also been documented on my post-operative worksheet that I review with and give to each patient), overall prognosis, benefits to surgery, and risks, that include, but are not limited to: infection, wound healing issues, stiffness, persisting or worsening pain, numbness, nerve and soft tissue injury, surgery risks: need for further surgery, recurrent deformity, failure of procedure, complications of anesthesia, stroke, blood clot, major cardiopulmonary event, and loss of limb or life.  The patient wishes to proceed with surgical intervention as described by myself and, as such, both the patient legal guardian: patient and I signed an informed surgical consent.      Arlyss Repress, MD  Assistant Professor-   Foot & Ankle Surgery  Department of [facility]

## 2021-08-12 ENCOUNTER — Telehealth (INDEPENDENT_AMBULATORY_CARE_PROVIDER_SITE_OTHER): Payer: Self-pay | Admitting: Orthopaedic Surgery

## 2021-08-12 NOTE — Nursing Note (Signed)
Received FMLA form on 08/05/21.  Completed and faxed form back to [employer] @ [phone] on 08/12/21.  A copy will be sent for scanning and a copy will be mailed to the patient.     Ashley Mariner, RN

## 2021-08-16 ENCOUNTER — Encounter (HOSPITAL_BASED_OUTPATIENT_CLINIC_OR_DEPARTMENT_OTHER): Payer: 59 | Admitting: Student in an Organized Health Care Education/Training Program

## 2021-08-16 ENCOUNTER — Other Ambulatory Visit: Payer: Self-pay

## 2021-08-16 ENCOUNTER — Other Ambulatory Visit (HOSPITAL_COMMUNITY): Payer: 59

## 2021-08-16 ENCOUNTER — Ambulatory Visit (HOSPITAL_BASED_OUTPATIENT_CLINIC_OR_DEPARTMENT_OTHER): Payer: 59 | Admitting: ANESTHESIOLOGY

## 2021-08-16 ENCOUNTER — Ambulatory Visit (HOSPITAL_COMMUNITY): Payer: 59 | Admitting: ANESTHESIOLOGY

## 2021-08-16 ENCOUNTER — Encounter (HOSPITAL_COMMUNITY): Payer: Self-pay | Admitting: Orthopaedic Surgery

## 2021-08-16 ENCOUNTER — Inpatient Hospital Stay (HOSPITAL_COMMUNITY): Payer: 59

## 2021-08-16 ENCOUNTER — Encounter (HOSPITAL_COMMUNITY)
Admission: RE | Disposition: A | Discharge status: Home / Self Care | Payer: Self-pay | Source: Ambulatory Visit | Attending: Orthopaedic Surgery

## 2021-08-16 ENCOUNTER — Encounter (HOSPITAL_COMMUNITY): Payer: 59 | Admitting: Student in an Organized Health Care Education/Training Program

## 2021-08-16 ENCOUNTER — Inpatient Hospital Stay
Admission: RE | Admit: 2021-08-16 | Discharge: 2021-08-16 | Disposition: A | Discharge status: Home / Self Care | Payer: 59 | Source: Ambulatory Visit | Attending: Orthopaedic Surgery | Admitting: Orthopaedic Surgery

## 2021-08-16 DIAGNOSIS — M25571 Pain in right ankle and joints of right foot: Secondary | ICD-10-CM

## 2021-08-16 DIAGNOSIS — S86311A Strain of muscle(s) and tendon(s) of peroneal muscle group at lower leg level, right leg, initial encounter: Secondary | ICD-10-CM | POA: Insufficient documentation

## 2021-08-16 DIAGNOSIS — M65871 Other synovitis and tenosynovitis, right ankle and foot: Secondary | ICD-10-CM

## 2021-08-16 DIAGNOSIS — A1809 Other musculoskeletal tuberculosis: Secondary | ICD-10-CM

## 2021-08-16 SURGERY — REPAIR TENDON TENOLYSIS
Anesthesia: Monitor Anesthesia Care | Site: Ankle | Laterality: Right | Wound class: Clean Wound: Uninfected operative wounds in which no inflammation occurred

## 2021-08-16 MED ORDER — HYDROMORPHONE 1 MG/ML INJECTION WRAPPER
0.4000 mg | INJECTION | INTRAMUSCULAR | Status: DC | PRN
Start: 2021-08-16 — End: 2021-08-16

## 2021-08-16 MED ORDER — LACTATED RINGERS INTRAVENOUS SOLUTION
INTRAVENOUS | Status: DC
Start: 2021-08-16 — End: 2021-08-16

## 2021-08-16 MED ORDER — MIDAZOLAM 1 MG/ML INJECTION SOLUTION
2.0000 mg | Freq: Once | INTRAMUSCULAR | Status: DC | PRN
Start: 2021-08-16 — End: 2021-08-16
  Administered 2021-08-16: 2 mg via INTRAVENOUS
  Filled 2021-08-16: qty 2

## 2021-08-16 MED ORDER — HYDROMORPHONE 1 MG/ML INJECTION WRAPPER
0.2000 mg | INJECTION | INTRAMUSCULAR | Status: DC | PRN
Start: 2021-08-16 — End: 2021-08-16

## 2021-08-16 MED ORDER — SODIUM CHLORIDE 0.9% FLUSH BAG - 250 ML
INTRAVENOUS | Status: DC | PRN
Start: 2021-08-16 — End: 2021-08-16

## 2021-08-16 MED ORDER — SUGAMMADEX 100 MG/ML INTRAVENOUS SOLUTION
INTRAVENOUS | Status: AC
Start: 2021-08-16 — End: 2021-08-16
  Filled 2021-08-16: qty 2

## 2021-08-16 MED ORDER — SENNOSIDES 8.6 MG TABLET
8.6000 mg | ORAL_TABLET | Freq: Every evening | ORAL | 0 refills | Status: AC | PRN
Start: 2021-08-16 — End: 2021-08-23
  Filled 2021-08-16: qty 7, 7d supply, fill #0

## 2021-08-16 MED ORDER — FENTANYL (PF) 50 MCG/ML INJECTION SOLUTION
INTRAMUSCULAR | Status: AC
Start: 2021-08-16 — End: 2021-08-16
  Filled 2021-08-16: qty 2

## 2021-08-16 MED ORDER — CEFAZOLIN 10 GRAM SOLUTION FOR INJECTION
3.0000 g | Freq: Once | INTRAMUSCULAR | Status: AC
Start: 2021-08-16 — End: 2021-08-16
  Administered 2021-08-16: 3 g via INTRAVENOUS
  Filled 2021-08-16: qty 30

## 2021-08-16 MED ORDER — SODIUM CHLORIDE 0.9 % (FLUSH) INJECTION SYRINGE
2.0000 mL | INJECTION | INTRAMUSCULAR | Status: DC | PRN
Start: 2021-08-16 — End: 2021-08-16

## 2021-08-16 MED ORDER — DEXAMETHASONE SODIUM PHOSPHATE 4 MG/ML INJECTION SOLUTION
Freq: Once | INTRAMUSCULAR | Status: DC | PRN
Start: 2021-08-16 — End: 2021-08-16
  Administered 2021-08-16: 4 mg via INTRAVENOUS

## 2021-08-16 MED ORDER — BUPIVACAINE (PF) 0.25 % (2.5 MG/ML) INJECTION SOLUTION
30.0000 mL | Freq: Once | INTRAMUSCULAR | Status: DC | PRN
Start: 2021-08-16 — End: 2021-08-16
  Administered 2021-08-16: 40 mL via INTRAMUSCULAR

## 2021-08-16 MED ORDER — ONDANSETRON HCL (PF) 4 MG/2 ML INJECTION SOLUTION
Freq: Once | INTRAMUSCULAR | Status: DC | PRN
Start: 2021-08-16 — End: 2021-08-16
  Administered 2021-08-16: 4 mg via INTRAVENOUS

## 2021-08-16 MED ORDER — SUGAMMADEX 100 MG/ML INTRAVENOUS SOLUTION
Freq: Once | INTRAVENOUS | Status: DC | PRN
Start: 2021-08-16 — End: 2021-08-16
  Administered 2021-08-16: 200 mg via INTRAVENOUS

## 2021-08-16 MED ORDER — SUCCINYLCHOLINE 20 MG/ML INTRAVENOUS WRAPPER
INJECTION | Freq: Once | INTRAVENOUS | Status: DC | PRN
Start: 2021-08-16 — End: 2021-08-16
  Administered 2021-08-16: 140 mg via INTRAVENOUS

## 2021-08-16 MED ORDER — DEXTROSE 5% IN WATER (D5W) FLUSH BAG - 250 ML
INTRAVENOUS | Status: DC | PRN
Start: 2021-08-16 — End: 2021-08-16

## 2021-08-16 MED ORDER — LIDOCAINE (PF) 100 MG/5 ML (2 %) INTRAVENOUS SYRINGE
INJECTION | Freq: Once | INTRAVENOUS | Status: DC | PRN
Start: 2021-08-16 — End: 2021-08-16
  Administered 2021-08-16: 100 mg via INTRAVENOUS

## 2021-08-16 MED ORDER — DEXMEDETOMIDINE 4 MCG/ML IV DILUTION
Freq: Once | INTRAMUSCULAR | Status: DC | PRN
Start: 2021-08-16 — End: 2021-08-16
  Administered 2021-08-16: 10 ug via INTRAVENOUS

## 2021-08-16 MED ORDER — GABAPENTIN 600 MG TABLET
300.0000 mg | ORAL_TABLET | Freq: Every evening | ORAL | 0 refills | Status: AC
Start: 2021-08-16 — End: ?
  Filled 2021-08-16: qty 7, 14d supply, fill #0

## 2021-08-16 MED ORDER — VANCOMYCIN 1,000 MG IV POWDER - FOR OR
1.0000 g | Freq: Once | TOPICAL | Status: AC
Start: 2021-08-16 — End: 2021-08-16
  Administered 2021-08-16: 1 g

## 2021-08-16 MED ORDER — OXYCODONE 5 MG TABLET
5.0000 mg | ORAL_TABLET | Freq: Four times a day (QID) | ORAL | 0 refills | Status: AC | PRN
Start: 2021-08-16 — End: ?
  Filled 2021-08-16: qty 28, 7d supply, fill #0

## 2021-08-16 MED ORDER — ACETAMINOPHEN 500 MG TABLET
500.0000 mg | ORAL_TABLET | Freq: Four times a day (QID) | ORAL | 0 refills | Status: AC | PRN
Start: 2021-08-16 — End: ?
  Filled 2021-08-16: qty 90, 23d supply, fill #0

## 2021-08-16 MED ORDER — FENTANYL (PF) 50 MCG/ML INJECTION SOLUTION
Freq: Once | INTRAMUSCULAR | Status: DC | PRN
Start: 2021-08-16 — End: 2021-08-16
  Administered 2021-08-16 (×2): 25 ug via INTRAVENOUS

## 2021-08-16 MED ORDER — ONDANSETRON HCL 4 MG TABLET
4.0000 mg | ORAL_TABLET | Freq: Three times a day (TID) | ORAL | 0 refills | Status: AC | PRN
Start: 2021-08-16 — End: 2021-08-23
  Filled 2021-08-16: qty 21, 7d supply, fill #0

## 2021-08-16 MED ORDER — CHOLECALCIFEROL (VITAMIN D3) 125 MCG (5,000 UNIT) CAPSULE
5000.0000 [IU] | ORAL_CAPSULE | Freq: Every day | ORAL | 0 refills | Status: AC
Start: 2021-08-16 — End: ?
  Filled 2021-08-16: qty 30, 30d supply, fill #0

## 2021-08-16 MED ORDER — SODIUM CHLORIDE 0.9 % (FLUSH) INJECTION SYRINGE
2.0000 mL | INJECTION | Freq: Three times a day (TID) | INTRAMUSCULAR | Status: DC
Start: 2021-08-16 — End: 2021-08-16

## 2021-08-16 MED ORDER — PROPOFOL 10 MG/ML IV BOLUS
INJECTION | Freq: Once | INTRAVENOUS | Status: DC | PRN
Start: 2021-08-16 — End: 2021-08-16
  Administered 2021-08-16: 100 mg via INTRAVENOUS
  Administered 2021-08-16: 50 mg via INTRAVENOUS

## 2021-08-16 MED ORDER — SODIUM CHLORIDE 0.9 % IRRIGATION SOLUTION
1000.0000 mL | Status: DC | PRN
Start: 2021-08-16 — End: 2021-08-16
  Administered 2021-08-16: 1000 mL

## 2021-08-16 MED ORDER — MIDAZOLAM 1 MG/ML INJECTION SOLUTION
INTRAMUSCULAR | Status: AC
Start: 2021-08-16 — End: 2021-08-16
  Filled 2021-08-16: qty 2

## 2021-08-16 MED ORDER — FENTANYL (PF) 50 MCG/ML INJECTION SOLUTION
100.0000 ug | Freq: Once | INTRAMUSCULAR | Status: DC | PRN
Start: 2021-08-16 — End: 2021-08-16
  Administered 2021-08-16: 50 ug via INTRAVENOUS
  Filled 2021-08-16: qty 2

## 2021-08-16 MED ORDER — ACETAMINOPHEN 1,000 MG/100 ML (10 MG/ML) INTRAVENOUS SOLUTION
Freq: Once | INTRAVENOUS | Status: DC | PRN
Start: 2021-08-16 — End: 2021-08-16
  Administered 2021-08-16: 1000 mg via INTRAVENOUS

## 2021-08-16 MED ORDER — VANCOMYCIN 1,000 MG IV POWDER - FOR OR
1.0000 g | Freq: Once | TOPICAL | Status: DC
Start: 2021-08-16 — End: 2021-08-16

## 2021-08-16 MED ORDER — ELIQUIS 5 MG TABLET
5.0000 mg | ORAL_TABLET | Freq: Two times a day (BID) | ORAL | 0 refills | Status: DC
Start: 2021-08-16 — End: 2021-08-31
  Filled 2021-08-16: qty 56, 28d supply, fill #0

## 2021-08-16 MED ORDER — SODIUM CHLORIDE 0.9 % (FLUSH) INJECTION SYRINGE
20.0000 mL | INJECTION | Freq: Once | INTRAMUSCULAR | Status: DC | PRN
Start: 2021-08-16 — End: 2021-08-16

## 2021-08-16 MED ORDER — ASCORBIC ACID (VITAMIN C) 500 MG TABLET
500.0000 mg | ORAL_TABLET | Freq: Every day | ORAL | 0 refills | Status: AC
Start: 2021-08-16 — End: ?
  Filled 2021-08-16: qty 30, 30d supply, fill #0

## 2021-08-16 MED ORDER — ROCURONIUM 10 MG/ML INTRAVENOUS SYRINGE WRAPPER
INJECTION | Freq: Once | INTRAVENOUS | Status: DC | PRN
Start: 2021-08-16 — End: 2021-08-16
  Administered 2021-08-16: 50 mg via INTRAVENOUS

## 2021-08-16 SURGICAL SUPPLY — 70 items
APPL 70% ISPRP 2% CHG 26ML 13._2X13.2IN CHLRPRP PREP DEHP-FR (MED SURG SUPPLIES) ×3
APPL 70% ISPRP 2% CHG 26ML CHLRPRP HI-LT ORNG PREP STRL LF  DISP CLR (MED SURG SUPPLIES) ×2 IMPLANT
BANDAGE 3M COBAN 5.1YDX4IN 2.9_YDX4IN 2 LYR THRP FULL STRCH (WOUND CARE/ENTEROSTOMAL SUPPLY) ×3
BANDAGE 4.1YDX4.5IN 6 PLY HYPOALL COTTON LRG GAUZE WHT STRL LF  DISP (WOUND CARE SUPPLY) ×2 IMPLANT
BANDAGE 4.1YDX4.5IN 6 PLY HYPO_ALL COTTON LRG GAUZE WHT STRL (WOUND CARE/ENTEROSTOMAL SUPPLY) ×3
BANDAGE 5.5YDX4IN NONST ELAS KNIT 2 SLFCLS COTTON COMPRESS (WOUND CARE SUPPLY) IMPLANT
BANDAGE 5.5YDX6IN NONST ELAS KNIT 2 SLFCLS COTTON COMPRESS (WOUND CARE SUPPLY) IMPLANT
BANDAGE COFLX NL 5YDX4IN EASYTEAR CHSV CNTRL STRNG SFT (WOUND CARE/ENTEROSTOMAL SUPPLY) ×3
BANDAGE COFLX NL 5YDX4IN STRL CHSV SFT FOAM COMPRESS TAN LF (WOUND CARE SUPPLY) ×2 IMPLANT
BANDAGE ESMARK 12FTX4IN STRL ELAS COMPRESS BLU LF (WOUND CARE SUPPLY) ×2 IMPLANT
BANDAGE ESMARK 12FTX4IN STRL E_LAS COMPRESS BLU LF (WOUND CARE/ENTEROSTOMAL SUPPLY) ×3
BANDAGE ESMARK 12FTX4IN STRL SYN COMPRESS LF (WOUND CARE SUPPLY) ×2 IMPLANT
BANDAGE ESMARK 12FTX4IN STRL S_YN COMPRESS LF (WOUND CARE/ENTEROSTOMAL SUPPLY) ×3
BLADE SAW .98X.36IN OSC SGTL T_HK.015IN MICRO PREC TPS (CUTTING ELEMENTS)
BLADE SAW 11MM 40MM LAPIPLASTY (CUTTING ELEMENTS)
BLADE SAW 11MM 40MM LAPIPLASTY (SURGICAL CUTTING SUPPLIES) IMPLANT
BLADE SAW 18.5X7MM OSC SGTL SS_THK.38MM MED NRW THN PREC 35 (CUTTING ELEMENTS)
BLADE SAW 18.5X7MM OSCILLATE SGTL SS THK.38MM MED NRW THN PREC STRL LF  DISP (SURGICAL CUTTING SUPPLIES) IMPLANT
BLADE SAW 25X9MM OSCILLATE SGTL AGRS OFST SS THK.38MM MED THN PREC STRL LF  DISP (SURGICAL CUTTING SUPPLIES) ×2 IMPLANT
BLADE SAW 25X9MM RECIPROCATE THK.38MM THN PREC (SURGICAL CUTTING SUPPLIES) IMPLANT
BLADE SAW 31X9MM OSCILLATE SGT_L SS LONG MED PREC STRL (CUTTING ELEMENTS)
BLADE SAW 9MM OSCILLATE SGTL A_GRS THN SS THK.38MM 25MM MED (CUTTING ELEMENTS) ×3
BLADE SAW 9MM OSCILLATE SGTL SS THK.51MM 31MM MED LONG PREC STRL LF  DISP (SURGICAL CUTTING SUPPLIES) IMPLANT
BLANKET MISTRAL-AIR ADULT UPR BODY 79X29.9IN FRC AIR HI VOL BLWR INTUITIVE CONTROL PNL LRG LED (MED SURG SUPPLIES) ×2 IMPLANT
BLANKET MISTRAL-AIR UPR BODY 7_8.7X29.9IN FRC AIR WARM (MED SURG SUPPLIES) ×3
COMPRESS COBAN 5.1YDX4IN 2.9YDX4IN 2 LYR FULL STRCH HYPOALL SYSTEM LF  TAN (WOUND CARE/ENTEROSTOMAL SUPPLY) ×2
CONV USE 405188 - CUFF TOURNIQUET BRN 34X4IN ATS 3K CYL 2 PORT 1 BLADDER POS LOCK CONN SLEEVE STRL LF  DISP (MED SURG SUPPLIES) IMPLANT
CONV USE ITEM 338663 - PACK SURG CUSTOM EXTREMITY NONST DISP LF (CUSTOM TRAYS & PACK) ×2 IMPLANT
CONV USE ITEM 343165 - COMPRESS COBAN 5.1YDX4IN 2.9YDX4IN 2 LYR FULL STRCH HYPOALL SYSTEM LF  TAN (WOUND CARE SUPPLY) ×2 IMPLANT
CUFF TOURNIQUET BRN 34X4IN ATS 3K CYL 2 PORT 1 BLADDER POS LOCK CONN SLEEVE STRL LF  DISP (MED SURG SUPPLIES)
DEVICE DRUG DEL 20MM STRL LF (MED SURG SUPPLIES) ×3 IMPLANT
DISC USE ITEM 307130 - GOWN SURG XL XLNG AAMI L4 IMPR_V BRTHBL HKLP CLSR STRL LF (DRAPE/PACKS/SHEETS/OR TOWEL) ×2
DISCONTINUED USE ITEM 307130 - GOWN SURG XL XLNG AAMI L4 IMPR_V BRTHBL HKLP CLSR STRL LF (DRAPE/PACKS/SHEETS/OR TOWEL) ×2
DISCONTINUED USE ITEM 342873 - GOWN SURG XL XLNG AAMI L4 IMPR_V BRTHBL HKLP CLSR STRL LF (DRAPE/PACKS/SHEETS/OR TOWEL) ×2 IMPLANT
DRAPE ADH 51X47IN STRDRP LF  STRL DISP SURG CLR (DRAPE/PACKS/SHEETS/OR TOWEL) ×2 IMPLANT
DRAPE ADH 51X47IN U STRDRP LF_STRL DISP SURG PLASTIC CLR (DRAPE/PACKS/SHEETS/OR TOWEL) ×3
DRAPE CARM FLRSCP EXPD CLPSBL C-ARMOR STRL EQP (DRAPE/PACKS/SHEETS/OR TOWEL) ×2 IMPLANT
DRAPE CARM FLRSCP EXPD CLPSBL_C-ARMOR STRL EQP (DRAPE/PACKS/SHEETS/OR TOWEL) ×3
DRAPE CARM POLY STRAP MBL XRY 72X42IN LF  EQP (DRAPE/PACKS/SHEETS/OR TOWEL) ×2 IMPLANT
DRAPE CARM POLY STRAP MBL XRY_72X42IN LF EQP (DRAPE/PACKS/SHEETS/OR TOWEL) ×3
DRAPE FLRSCN CARM STRAP 85X54IN MINI KOVER LF  STRL EQP POLY (DRAPE/PACKS/SHEETS/OR TOWEL) IMPLANT
DRAPE FLRSCN CARM STRAP 85X54I_N MINI KOVER LF STRL EQP POLY (DRAPE/PACKS/SHEETS/OR TOWEL)
DRAPE IMPRV SPLT ADH 76X54IN LF  STRL DISP SURG SMS 24X6IN (DRAPE/PACKS/SHEETS/OR TOWEL) ×4 IMPLANT
DRAPE IMPRV SPLT ADH 76X54IN L_F STRL DISP SURG SMS 24X6IN (DRAPE/PACKS/SHEETS/OR TOWEL) ×6
DRESS PETRO 8X1IN CURAD XR COTTON GAUZE NADH OCL IMPREGNATE LF  STRL WHT (WOUND CARE SUPPLY) IMPLANT
DRESS PETRO 8X1IN CURAD XR COT_TON GAUZE NADH OCL IMPREGNATE (WOUND CARE/ENTEROSTOMAL SUPPLY)
DRESS WOUND 4X4IN JUMPSTART ANTIMIC ORDER IN MULTIPLES OF 10 EACH (WOUND CARE SUPPLY) IMPLANT
DRESS WOUND 5X4IN MPTL AG SAFETAC NONADH ANTIMIC LF  STRL (WOUND CARE SUPPLY) IMPLANT
DRESSING JUMPSTART 4X4 (WOUND CARE/ENTEROSTOMAL SUPPLY)
DRESSING WOUND SILVER 4X5 (WOUND CARE/ENTEROSTOMAL SUPPLY)
GARMENT COMPRESS MED CALF CENTAURA NYL VASOGRAD LTWT BRTHBL SEQ FIL BLU 18- IN (MED SURG SUPPLIES) ×2 IMPLANT
GARMENT COMPRESS MED CALF CENT_AURA NYL VASOGRAD LTWT BRTHBL (MED SURG SUPPLIES) ×3
GOWN SURG XL XLNG AAMI L4 IMPR_V BRTHBL HKLP CLSR STRL LF (DRAPE/PACKS/SHEETS/OR TOWEL) ×1
KIT INSTR BIOABS BIOTENOD DISP STRL (ORTHOPEDICS (NOT IMPLANTS)) IMPLANT
KIT INSTR BIOABS BIOTENOD DISP_STRL (ORTHOPEDICS (NOT IMPLANTS))
MBO USE ITEM 133984 - BANDAGE 5.5YDX4IN NONST ELAS KNIT 2 SLFCLS COTTON COMPRESS (WOUND CARE/ENTEROSTOMAL SUPPLY)
MBO USE ITEM 150865 OR 133985 - BANDAGE 5.5YDX6IN NONST ELAS KNIT 2 SLFCLS COTTON COMPRESS (WOUND CARE/ENTEROSTOMAL SUPPLY)
PACK CUSTOM EXTREMITY (CUSTOM TRAYS & PACK) ×3
PACK SURG CSTM EXTREMITY NONST DISP LF (CUSTOM TRAYS & PACK) ×2
PAD ABDOMINAL 10X8IN LF  STRL DISP (WOUND CARE SUPPLY) ×8 IMPLANT
PAD ABDOMINAL 8X10 STRL (WOUND CARE/ENTEROSTOMAL SUPPLY) ×12
RETRIEVER SUT 10.1IN HWSN DRIL_L GUIDE LGMNT STRL LF (SUTURE AIDS)
RETRIEVER SUT 10.1IN HWSN LGMNT DRILL GUIDE STRL LF (SUTURE AIDS) IMPLANT
SPONGE GAUZE 4X4IN MDCHC COTTON 12 PLY TY 7 LF  STRL DISP (WOUND CARE SUPPLY) ×2 IMPLANT
SPONGE GAUZE 4X4IN MDCHC COTTO_N 12 PLY TY 7 LF STRL DISP (WOUND CARE/ENTEROSTOMAL SUPPLY) ×3
STAPLER SKIN 35 CNT REG STPL CART ERG HNDL REM LF  APPOSE DISP CLR SS PLASTIC (SUTURE/WOUND CLOSURE) IMPLANT
STAPLER SKIN REG STRL 35 COUNT_8886803512 12EA/BX (SUTURE/WOUND CLOSURE)
STKNT ORTHO 72X6IN COTTON ALBHL 1 PLY PCUT LF  TUB STRL NATURAL (ORTHOPEDICS (NOT IMPLANTS)) ×2 IMPLANT
STOCKINETTE ORTHO 6X6IN PRECUT_COTTON STRL 7666 (ORTHOPEDICS (NOT IMPLANTS)) ×3
TOURNIQUET PORT SNGL CUFF 34IN_DISP 60707015600 (MED SURG SUPPLIES)

## 2021-08-16 NOTE — Anesthesia Preprocedure Evaluation (Signed)
ANESTHESIA PRE-OP EVALUATION  Eric Cannon  Planned Procedure: REPAIR COLLATERAL LIGAMENT ANKLE (Right)  REPAIR TENDON TENOLYSIS (Right)  REPAIR TENDON LEG (Right)  OSTEOTOMY FIBULA (Right: Ankle)  APPLICATION SPLINT (Right)  Review of Systems     anesthesia history negative     patient summary reviewed          Pulmonary  negative pulmonary ROS,    Cardiovascular    Hypertension and hyperlipidemia ,No peripheral edema,        GI/Hepatic/Renal    GERD and well controlled        Endo/Other   neg endo/other ROS,       Neuro/Psych/MS   negative neuro/psych ROS,      Cancer                      Physical Assessment      Airway       Mallampati: I    TM distance: >3 FB    Neck ROM: full  Mouth Opening: good.  Facial hair  Beard  No endotracheal tube present  No Tracheostomy present    Dental       Dentition intact             Pulmonary    Breath sounds clear to auscultation  (-) no rhonchi, no decreased breath sounds, no wheezes, no rales and no stridor     Cardiovascular    Rhythm: regular  Rate: Normal  (-) no friction rub, carotid bruit is not present, no peripheral edema and no murmur     Other findings            Plan  ASA 3     Planned anesthesia type: MAC               Additional Plans: Pre-op Block    PONV/POV Plan:  I plan to administer pharmcologic prophalaxis antiemetics  Intravenous induction     Anesthesia issues/risks discussed are: PONV, Dental Injuries, Stroke, Sore Throat, Cardiac Events/MI, Eye /Visual Loss, Blood Loss, Aspiration, Intraoperative Awareness/ Recall, Nerve Injuries, Failure of Block and Local Anesthetic Systemic Toxicity.  Anesthetic plan and risks discussed with patient  Signed consent obtained        Use of blood products discussed with patient who consented to blood products.     Patient's NPO status is appropriate for Anesthesia.           Plan discussed with attending.                 Serena Colonel, MD 08/16/2021, 10:49   [facility] Anesthesiology

## 2021-08-16 NOTE — Anesthesia Preprocedure Evaluation (Signed)
ANESTHESIA PRE-OP EVALUATION  Eric Cannon  Planned Procedure: ANES - PAIN BLOCK  Review of Systems     anesthesia history negative     patient summary reviewed          Pulmonary  negative pulmonary ROS,    Cardiovascular    Hypertension and hyperlipidemia ,No peripheral edema,        GI/Hepatic/Renal    GERD and well controlled        Endo/Other   neg endo/other ROS,       Neuro/Psych/MS   negative neuro/psych ROS,      Cancer                      Physical Assessment      Airway       Mallampati: I    TM distance: >3 FB    Neck ROM: full  Mouth Opening: good.  Facial hair  Beard  No endotracheal tube present  No Tracheostomy present    Dental       Dentition intact             Pulmonary    Breath sounds clear to auscultation  (-) no rhonchi, no decreased breath sounds, no wheezes, no rales and no stridor     Cardiovascular    Rhythm: regular  Rate: Normal  (-) no friction rub, carotid bruit is not present, no peripheral edema and no murmur     Other findings            Plan  ASA 3     Planned anesthesia type: general and general endotracheal     general anesthesia with endotracheal tube intubation            Additional Plans: Pre-op Block    PONV/POV Plan:  I plan to administer pharmcologic prophalaxis antiemetics  Intravenous induction     Anesthesia issues/risks discussed are: PONV, Dental Injuries, Stroke, Sore Throat, Cardiac Events/MI, Eye /Visual Loss, Blood Loss, Aspiration, Intraoperative Awareness/ Recall, Nerve Injuries, Failure of Block and Local Anesthetic Systemic Toxicity.  Anesthetic plan and risks discussed with patient  Signed consent obtained        Use of blood products discussed with patient who consented to blood products.     Patient's NPO status is appropriate for Anesthesia.           Plan discussed with attending.                 Serena Colonel, MD 08/16/2021, 10:49   [facility] Anesthesiology

## 2021-08-16 NOTE — Nurses Notes (Signed)
Patient adequate for discharge. Tolerating PO fluids. Dressing clean, dry, and intact. Patient and spouse educated on discharge instructions. All questions encouraged and answered. No further questions by patient and spouse. IV removed. Patient transported to car via wheelchair.

## 2021-08-16 NOTE — Discharge Instructions (Signed)
SURGICAL DISCHARGE INSTRUCTIONS     Dr. Tera Helper, MD  performed your REPAIR TENDON TENOLYSIS, REPAIR TENDON LEG, APPLICATION SPLINT today at the [facility]:  Monday through Friday from 6 a.m. - 7 p.m.: [phone]  Between 7 p.m. - 6 a.m., weekends and holidays:  Call [facility] at [phone] or [phone].    PLEASE SEE WRITTEN HANDOUTS AS DISCUSSED BY YOUR NURSE:      SIGNS AND SYMPTOMS OF A WOUND / INCISION INFECTION   Be sure to watch for the following:  Increase in redness or red streaks near or around the wound or incision.  Increase in pain that is intense or severe and cannot be relieved by the pain medication that your doctor has given you.  Increase in swelling that cannot be relieved by elevation of a body part, or by applying ice, if permitted.  Increase in drainage, or if yellow / green in color and smells bad. This could be on a dressing or a cast.  Increase in fever for longer than 24 hours, or an increase that is higher than 101 degrees Fahrenheit (normal body temperature is 98 degrees Fahrenheit). The incision may feel warm to the touch.    **CALL YOUR DOCTOR IF ONE OR MORE OF THESE SIGNS / SYMPTOMS SHOULD OCCUR.    ANESTHESIA INFORMATION   ANESTHESIA -- ADULT PATIENTS:  You have received intravenous sedation / general anesthesia, and you may feel drowsy and light-headed for several hours. You may even experience some forgetfulness of the procedure. DO NOT DRIVE A MOTOR VEHICLE or perform any activity requiring complete alertness or coordination until you feel fully awake in about 24-48 hours. Do not drink alcoholic beverages for at least 24 hours. Do not stay alone, you must have a responsible adult available to be with you. You may also experience a dry mouth or nausea for 24 hours. This is a normal side effect and will disappear as the effects of the medication wear off.    REMEMBER   If you experience any difficulty breathing, chest pain,  bleeding that you feel is excessive, persistent nausea or vomiting or for any other concerns:  Call your physician  at [phone] or [phone]. You may also ask to have the  doctor on call paged. They are available to you 24 hours a day.    SPECIAL INSTRUCTIONS / COMMENTS   See handout    FOLLOW-UP APPOINTMENTS   Please call patient services at [phone] or [phone] to schedule a date / time of return. They are open Monday - Friday from 7:30 am - 5:00 pm.

## 2021-08-16 NOTE — Anesthesia Procedure Notes (Addendum)
Eric Cannon    Block: Peripheral Block    Performed By:   Authorizing Provider:  Collins Scotland, MD  Performing Provider:  Serena Colonel, MD  I was present and supervised/observed the entire procedure.  Briana Newman, MD 08/16/2021, 10:57   Sedation  The patient was continuously monitored throughout the procedure and in recovery. I was in attendance and supervised the sedation (during the start and stop times listed below) and remained immediately available until the patient returned to pre-procedure baseline.  [name], MD 08/16/2021, 10:57  Sedation Start Time  08/16/2021 10:20 AM   Sedation Stop Time 08/16/2021 10:29 AM  Blocks  Right saphenous  Type of Block: single shot  Ultrasound used for needle placement, imaging, supervision, and interpretation  Image:  images available in PACS  Diagnosis: ankle pain   Indication: Requested by surgeon and Acute post operative pain   Pt location: at Bedside  Requesting Surgeon: Tera Helper, MD  Site verified, H&P updated and consent obtained, Patient monitors applied, Timeout performed, Emergency drugs and equipment available, Patient positioned and anesthesia consent given  Technique(See MAR for doses)       Preprocedure hand washing was performed sterile field maintained      Sterile Skin Prep : Aseptic technique, Sterile gloves, Supplies assembled on sterile field, Mask, cap, Sterile technique, Hand hygiene performed, Sterile field established and Sterilely prepped and draped    Skin prepped with: Chlorhexidine gluconate and isopropyl alcohol    Skin Local      Needle  Needle type: Stimuplex     Needle Gauge: 21G   Needle length: 4 in.  Needle localization: anatomical landmarks and ultrasound guidance  Number of attempts: 1      Catheter          Site      Medications    Assessment  Injection assessment: negative aspiration for heme and local visualized surrounding nerve on ultrasound  Paresthesia pain: none    Heart Rate changed: No    Events     Patient tolerance of  procedure: tolerated well, no immediate complications        NOTES: Continuous ultrasound was used to guide the needle tip in close proximity of the neural target.  There was minimal disruption of musculoskeletal and vascular structures with exceptions as noted. An in-plane long-axis image was saved to the patient's medical record.  Appropriate hydrodissection was achieved and no intraneural injection was appreciated/occurred.    CPT Code 78676    Serena Colonel, MD 08/16/2021   [facility]  [facility] Anesthesiology Department  [phone]

## 2021-08-16 NOTE — H&P (Signed)
[facility]                                                     H&P Update Form    Eric, Cannon, 51 y.o. male  Date of Admission:  08/16/2021  Date of Birth:  1970/05/06    08/16/2021    Pre-Surgical H & P updated the day of the procedure.  1.  H&P completed within 30 days of surgical procedure was performed by Dr. Iris Pert on 08/05/21 and has been reviewed, the patient has been examined, and no change has occured in the patients condition since the H&P was completed.     Comments: No medication changes, denies cp/sob/nvd/fevers/chills, surgical site marked, consent in chart.    2.  Patient continues to be appropiate candidate for planned surgical procedure. YES    --  Dara Lords, MD 08/16/2021, 11:41

## 2021-08-16 NOTE — Consults (Signed)
[facility]  Acute Perioperative Pain Service Consult         Date of Service:  08/16/2021  Eric, Cannon, 51 y.o. male  Encounter Start Date:  08/16/2021  Inpatient Admission Date:    Date of Birth:  [birth year]    Plan:   Eric Cannon is a 51 y.o. male who is scheduled for   with anticipated post-operative pain.  - Offered patient R popliteal/adductor canal SS peripheral nerve blocks. Patient agreeable to procedure.   - Scheduled and PRN medications per primary service.   - For any questions please page or call the Acute Perioperative Pain Service at [phone].   - If not already done, please place consult order to IP Acute Perioperative Pain --RAP Thank you for your consultation.     Suggested Coding/Billing: Level 3    Type of Pain Consultation: Perioperative Pain  Consult Requested By: surgeon  Reason for Consult: Acute Postoperative Pain (ICD-10-G89.18)  Laterality right, Type of Surgery, R ankle repair collateral ligament    Chief Complaint/Pain location: R ankle    History of Present Illness: Eric Cannon is a 51 y.o., White male  with chief complaint as above. Anesthesiology Perioperative Pain service consulted for evaluation of R ankle pain. The pain is anticipated to be described as sharp post surgical pain. Pain will be located at the surgical site. Onset is anticipated to start post-operatively. Aggravating factors: movement.  Alleviating factors: pain medications.       Past Medical History:  Past Medical History:   Diagnosis Date    Cancer (CMS [place])     skin cancer removed from right shoulder    Esophageal reflux     Essential hypertension     Heart murmur     as a child    Hyperlipidemia     Wears glasses          OSA diagnosed? no  History of Anticoagulant Use? No    Social History:   Social History     Tobacco Use    Smoking status: Never     Passive exposure: Never    Smokeless tobacco: Former     Types: Snuff     Quit date: 2007   Vaping Use    Vaping Use: Never used   Substance Use  Topics    Alcohol use: Yes     Comment: weekends--3-4 shots of liquor    Drug use: Never       Past Family History: none    ROS: Constitutional: negative for recent illnesses   Ears, nose, mouth, throat, and face: negative for nasal congestion, epistaxis, or tinnitus   Respiratory: negative for shortness of breath  Cardiovascular: negative for chest pain and dyspnea  Gastrointestinal: negative for nausea, vomiting  Integument/breast: negative for rash and skin lesion  Hematologic/lymphatic: negative for easy bruising and bleeding  Musculoskeletal: negative for neck pain  Neurological: negative for numbness and tingling  Allergy:   Allergies   Allergen Reactions    Adhesive      Swelling skin and rash     Asprin Ec Low Dose [Aspirin] Swelling         Exam:   Temperature: 37.1 C (98.8 F)  Heart Rate: 73  BP (Non-Invasive): (!) 150/94  Respiratory Rate: 18  SpO2: 94 %  General: appears in good health  HENT: Head atraumatic. normocephalic, ENT without erythema or injection, mucous membranes moist.  Neck: supple, symmetrical, trachea midline.  Lungs: Non-labored breathing.  Cardiovascular: regular rate and rhythm. Appears well perfused.   Extremities: extremities normal, atraumatic, no cyanosis or edema  Skin: No rashes or lesions  Neurologic: Alert and oriented x3. No gross motor or sensory deficits in bilateral extremities.  Psychiatric: Normal affect, behavior, speech      Labs:  I have reviewed all lab results.            I discussed treatment plan with patient including risks and benefits of procedures and patient consents.       Serena Colonel, MD   08/16/2021 10:45  Department of Anesthesiology  Regional Anesthesia Pager [phone]        I saw and examined the patient.  I reviewed the resident's note.  I agree with the findings and plan of care as documented in the resident's note.  Any exceptions/additions are edited/noted.    Collins Scotland, MD

## 2021-08-16 NOTE — Anesthesia Transfer of Care (Signed)
ANESTHESIA TRANSFER OF CARE   Eric Cannon is a 51 y.o. ,male, Weight: (!) 138 kg (304 lb 14.3 oz)   had Procedure(s) with comments:  REPAIR TENDON TENOLYSIS - tenodesis  REPAIR TENDON LEG  APPLICATION SPLINT - PT @ 0815(1st) - based on 1045 OR time  performed  08/16/21   Primary Service: Tera Helper, MD    Past Medical History:   Diagnosis Date   . Cancer (CMS [place])     skin cancer removed from right shoulder   . Esophageal reflux    . Essential hypertension    . Heart murmur     as a child   . Hyperlipidemia    . Wears glasses       Allergy History as of 08/16/21     ASPIRIN       Noted Status Severity Type Reaction    05/05/21 0950 Kalman Drape, Ambulatory Care Assistant 05/05/21 Active Low  Swelling          ADHESIVE       Noted Status Severity Type Reaction    08/05/21 1424 Kisner, Bishop Limbo, RN 08/05/21 Active       Comments: Swelling skin and rash                I completed my transfer of care / handoff to the receiving personnel during which we discussed:  Access, Airway, All key/critical aspects of case discussed, Analgesia, Antibiotics, Expectation of post procedure, Fluids/Product, Gave opportunity for questions and acknowledgement of understanding, Labs and PMHx      Post Location: PACU                                                           Last OR Temp: Temperature: 37.5 C (99.5 F)  ABG:   Airway:* No LDAs found *  Blood pressure 124/82, pulse 71, temperature 37.5 C (99.5 F), resp. rate (!) 24, height 1.829 m (6'), weight (!) 138 kg (304 lb 14.3 oz), SpO2 94 %.

## 2021-08-16 NOTE — Care Plan (Signed)
[facility]  Physical Therapy Initial Evaluation  Pre-operative Gait Evaluation    Patient Name: Eric Cannon  Date of Birth: [birth year]  Height:    Weight:    Room/Bed: PARE/PARE POOL  Payor: [employer] / Plan: [employer] / Product Type: PPO /     Assessment:   Pt successfully completed crutch training and stair training well.  Pt able to maintain NWB restriction of RLE during all transfers and ambulation. Does not have to perform stairs.  Pt would be safe for d/c to home when medically stable per MD.  Pt fitted and provided crutches for home use.     Discharge Needs:   Equipment Recommendations: Bariatric Crutches      The patient presents with mobility limitations due to weight bearing restrictions that significantly impair/prevent patient's ability to participate in mobility-related activities of daily living (MRADLs) including  ambulation and transfers in order to safely complete, safely entering/exiting the home, in reasonable time. This functional mobility deficit can be sufficiently resolved with the use of Bariatric crutches in order to decrease the risk of falls, morbidity, and mortality in performance of these MRADLs.  Patient is able to safely use this assistive device.    Discharge Disposition: Home with Assist    Plan:   Current Intervention: D/C PT  To provide physical therapy services for duration of crutch training and evaluation only.  The risks/benefits of therapy have been discussed with the patient/caregiver and he/she is in agreement with the established plan of care.       Subjective and Objective     Reports 1 level home with spouse and no STE, tub shower     Patient with c/o RLE pain, 1/10 at start and 1/10 at end of session.     Precautions: Anticipated NWB RLE  Patient seen in rehab gym for crutch training.    Transfers: sit to stand SBA, stand to sit SBA, stand pivot transfer SBA  Ambulation: SBA 50' NWB RLE  Stair  Training: n/a  Balance: static standing good, dynamic standing fair+   Functional Activity: fair+    The risks/benefits of therapy have been discussed with the patient/caregiver and he/she is in agreement with the established plan of care.     Therapist:   Nils Flack, PT 08/16/2021 08:45  Pager #: [phone]

## 2021-08-16 NOTE — Brief Op Note (Signed)
[facility]                                                    BRIEF OPERATIVE NOTE    Patient Name: Eric Cannon Number: [id number]  Date of Service: 08/16/2021  Date of Birth: Jul 17, 1970      Pre-Operative Diagnosis: Right peroneal tendonitis and tenosynovitis  Post-Operative Diagnosis: same  Procedure(s)/Description:  Right peroneus brevis tendon tenodesis  Findings: see operative report     Attending Surgeon: Iris Pert  Assistant(s): Natha Guin, PGY2    Tourniquet Time: 60 min  Anesthesia Type: General endotracheal anesthesia  Estimated Blood Loss:  Minimal  Blood Given: None  Fluids Given: 1000 cc crystalloid  Complications:  None  Wound Class: Clean Wound: Uninfected operative wounds in which no inflammation occurred    Tubes: None  Drains: None  Specimens/ Cultures: None  Implants: None           Disposition: PACU - hemodynamically stable.  Condition: stable    Dara Lords, MD  Department of Orthopaedics, PGY-2  Pager [phone]

## 2021-08-16 NOTE — Progress Notes (Signed)
[facility]  Orthopaedic Surgery - Post-op / Discharge Note    Ortho Attending: Dr. Iris Pert    Patient: Eric Cannon  DOB: December 04, 1970  MRN: [id number]  Date: 08/16/2021    SUBJECTIVE:   Patient stable in PACU, pain controlled    OBJECTIVE:   Gen: NAD  Lungs: non-labored breathing  CV: pulses regular rate  Abd: non-distended  Msk:  RLE:   dressing / splint in place, c/d/i  No motor/sensory function due to pre-op block  cap refill <2s to all toes    ASSESSMENT:   51 y.o. male s/p Right peroneus brevis tenodesis    PLAN:   -- recovery per PACU; d/c home from PACU when able  --Weightbearing status: NWB RLE  --Pain control: po, iv; d/c home with PO  --Diet: Resume home diet  --Medications: Resume home medications  --Admission / Disposition: Discharge home when stable and tolerating PO, f/u in Dr. Maximino Greenland clinic    --  Dara Lords, MD  Department of Orthopaedics, PGY-2  Pager [phone]  08/16/2021 13:58

## 2021-08-16 NOTE — OR PreOp (Signed)
Patient to 5W pre op.  Pre op procedures complete.  Family at bedside.

## 2021-08-16 NOTE — Anesthesia Procedure Notes (Addendum)
Eric Cannon    Block: Peripheral Block    Performed By:   Authorizing Provider:  Collins Scotland, MD  Performing Provider:  Serena Colonel, MD  I was present and supervised/observed the entire procedure.  Crysten Kaman, MD 08/16/2021, 10:57   Sedation  The patient was continuously monitored throughout the procedure and in recovery. I was in attendance and supervised the sedation (during the start and stop times listed below) and remained immediately available until the patient returned to pre-procedure baseline.  Oluwatosin Higginson, MD 08/16/2021, 10:57  Sedation Start Time  08/16/2021 10:20 AM   Sedation Stop Time 08/16/2021 10:29 AM  Blocks  Right sciatic Popliteal Fossa  Type of Block: single shot  Ultrasound used for needle placement, imaging, supervision, and interpretation  Image:  images available in PACS  Diagnosis: ankle pain   Indication: Requested by surgeon and Acute post operative pain   Pt location: at Bedside  Requesting Surgeon: Tera Helper, MD  Site verified, H&P updated and consent obtained, Patient monitors applied, Timeout performed, Emergency drugs and equipment available, Patient positioned and anesthesia consent given  Technique(See MAR for doses)       Preprocedure hand washing was performed sterile field maintained      Sterile Skin Prep : Aseptic technique, Sterile gloves, Supplies assembled on sterile field, Mask, cap, Sterile technique, Hand hygiene performed, Sterile field established and Sterilely prepped and draped    Skin prepped with: Chlorhexidine gluconate and isopropyl alcohol    Skin Local      Needle  Needle type: Stimuplex     Needle Gauge: 21G   Needle length: 4 in.  Needle localization: anatomical landmarks and ultrasound guidance  Number of attempts: 1      Catheter          Site      Medications    Assessment  Injection assessment: negative aspiration for heme, local visualized surrounding nerve on ultrasound and incremental injection  Paresthesia pain: none    Heart Rate changed:  No    Events     Patient tolerance of procedure: tolerated well, no immediate complications        NOTES: Continuous ultrasound was used to guide the needle tip in close proximity of the neural target.  There was minimal disruption of musculoskeletal and vascular structures with exceptions as noted. An in-plane long-axis image was saved to the patient's medical record.  Appropriate hydrodissection was achieved and no intraneural injection was appreciated/occurred.    CPT Code 54656    Serena Colonel, MD 08/16/2021   Regional Acute Pain Service  [facility] Anesthesiology Department  [phone]

## 2021-08-16 NOTE — Nurses Notes (Signed)
Rehab Services verified pt had crutch training this am, before surgery.  Wife has crutches with her.

## 2021-08-17 ENCOUNTER — Telehealth (HOSPITAL_COMMUNITY): Payer: Self-pay | Admitting: Orthopaedic Surgery

## 2021-08-17 NOTE — Telephone Encounter (Signed)
[place] / Department of Pharmaceutical Services   Antithrombotic Stewardship Program      Called patient to complete apixaban (Eliquis) education with Patient, Family Midwife. Discussed details of apixaban medication guide:     - Indication for anticoagulation: other (please note here): ppx  - Patient's specific indication for apixaban  - Take apixaban twice daily, 12 hours apart (addressed transition from loading dose to maintenance dose, if applicable)   - What to do if a dose was missed or taken incorrectly  - Notify your doctor if you are prescribed any new medications to make sure they do not interact with apixaban  - For pain, avoid NSAIDs if possible, use acetaminophen (speak to PCP about pain options in liver disease)  - Avoid and/or minimize alcohol intake  - Minor v. major side bleeding and when to seek immediate medical attention     Eric Cannon did verbalize good understanding        If concerns for understanding, what needs reinforced:   NA      Did patient pick up medication?   Yes      Are there any concerns with being able to pick up the medication in the future, including cost or other barriers such as transportation?   No      Notes or required follow up:   NA        Mat Carne, RN  08/17/2021, at 09:21  Antithrombotic Stewardship Program  Anticoagulation Education Nurse Specialist  Phone: [phone]

## 2021-08-17 NOTE — OR Surgeon (Signed)
[facility]        PATIENT NAME: Eric Cannon   MRN: [id number]     SURGERY DATE:  08/16/2021     SURGEON: Tera Helper, MD    ASSISTANTS:   Circulator: Magnus Sinning, RN; Laurann Montana, RN  Scrub Person: Remus Blake, CST  Dara Lords, MD      PREOPERATIVE DIAGNOSIS:  1- peroneal tendinopathy, right  2- peroneal tendon tear, right  3- prominent and painful peroneal tubercle, right      POSTOPERATIVE DIAGNOSIS: see surgeon's notes       PROCEDURE:  1- Tenolysis of peroneal tendon tenosynovitis, right  2- Tenodesis of peroneal brevis and longus tendons, right  3- excision of peroneal tubercle, right  4- application of short-leg splint, right      ANESTHESIA:   Anesthesiologist: Henrickson, Rebecca Eaton, MD  CRNA: Jeryl Columbia, CRNA; Grace Blight, CRNA   General/Block    COMPLICATIONS: none    ESTIMATED BLOOD LOSS:  Minimal    INDICATIONS:   The patient is a 51 y.o. year old male with diagnosis as detailed above that has failed an appropriate course of conservative treatment and is indicated for surgery as documented in the preoperative notes/history and physical.  He has elected to proceed with surgery for relief of symptoms .    We discussed the diagnosis, alternative treatment options, risks and benefits of the above surgical intervention, and all questions/concerns were addressed.  We further reviewed the associated risks and benefits of electing to pursue none of the non-operative or operative treatments.  The patient/family has verbalized a good understanding of the diagnosis, the planned procedure as above, anticipated post-operative course, overall prognosis, benefits to surgery, and risks, that include, but are not limited to: infection, wound healing issues, stiffness, persisting or worsening pain, numbness, nerve and soft tissue injury, need for further surgery, failure of procedure, complications of anesthesia, stroke, blood clot, major cardiopulmonary event, and loss of limb or  life.  The patient wishes to proceed with surgical intervention and both the patient and I signed an informed surgical consent as such, in each others presence.    DESCRIPTION OF PROCEDURE:   I identified and met the patient in the pre-operative area.  I appropriately marked the correct operative extremity on the patient with a surgical marker, with the patient as my witness.  The informed consent was re-reviewed by myself with the patient and all appropriate parties, and resigned/dated as necessary by the appropriate parties.  I addressed all remaining questions and concerns with the patient/family.  A standardized, preoperative verification process was completed.  Prior to being brought back to the OR, the anesthesia team performed a popliteal-sciatic block.     The patient was then brought back to the OR and positioned supine on the OR table. General anesthesia was induced and an SCD was placed on the nonoperative leg.  A non-sterile, well-padded thigh tourniquet was placed.  The operative leg was carefully positioned with a well-padded leg holder behind the knee, holding it in some flexion.     Standard, sterile prep with ChloraPrep and drape was then completed.  A surgical timeout was taken by myself and the participating anesthesia and OR staff, assuring the correct patient, procedure, operative extremity and site as previously marked, all necessary equipment/implants are available, and appropriate administration of IV antibiotics, including Ancef 3g within 1 hour of incision time.  The leg was gravity and esmarch exsanguinated, tourniquet put up to 275 mmHg.  A curvilinear incision is made obliquely over the distal fibula.  Careful subcutaneous dissection is carried down with tenotomy scissors, working in the interval between the superficial peroneal and sural nerves.  The inferior extensor retinaculum is identified and mobilized.       More proximally, careful dissection is carried down to the peroneal  tendon sheath, proximal and distal to the superior peroneal retinaculum (SPR).  No subluxation/dislocation of the peroneals over the distal fibula/SPR seen with stress examination.  Care is taken to protect any branches of the superficial peroneal and sural nerves proximally and distally, respectively.  The peroneal tendon sheath is incised.  The peroneal tendons are explored.    The peroneal brevis tendon was completely degenerated at the insertion.  The peroneus longus was hypertrophied with signs of tendon tearing. Tenosynovectomies were performed with #15 blade.  The peroneus brevis was debrided.  The prominent peroneal tubercle was exposed and found to be causing impingement on the tendon.  This was shaved with the osteotome then bone wax was applied.  The peroneus brevis tendon had good excursion on the muscle.  Decision was made to tenodesed the brevis and longus tendons. Using a Pulvertaft technique of the 2 tendons were tenodesed and then secured with Vicryl sutures.    There was excellent tendon excursion with ankle range of motion and no significant scarring within the tendon sheaths.       The sheath was thoroughly irrigated out and closed in interrupted fashion.  The peroneals were found to be stable in retrofibular position without subluxation/dislocation and tracked well.    The wounds were thoroughly irrigated then the tourniquet was deflated. Hemostasis was secured then the wounds were closed in layers with 3-0 Vicryl for the subcutaneous tissues and 3-0 Nylon for the skin.   A sterile dressing was applied followed by a well padded short leg splint.    The patient was woken up from anesthesia without difficulty and transported to PACU in stable condition.  All sponge and instrument counts were correct x 2.  There were no apparent complications.   Post-operative VTE prophylaxis plan will be: regular mobilization (encouraged to get up once or twice per hour while awake), elevation of the operative  extremity with "toes above the nose" while at rest for the first 6 weeks, and  Elquis until mobilizing appropriately.     DISPOSITION: PACU - hemodynamically stable.    ATTESTATION:   I was personally present and scrubbed in for the entire procedure that was performed with the assistance of Dara Lords, MD     POSTOP PLAN:  Weight-bearing status: NWB x 4-6 wks- splint for 2 weeks then Cam boot and allow ankle range of motion.  XR at 1st postop appt: None    Arlyss Repress, MD  Assistant Professor-   Foot & Ankle Surgery  Department of [employer] [facility] [employer]

## 2021-08-19 NOTE — Anesthesia Postprocedure Evaluation (Signed)
Anesthesia Post Op Evaluation    Patient: Eric Cannon  Procedure(s) with comments:  REPAIR TENDON TENOLYSIS - tenodesis  REPAIR TENDON LEG  APPLICATION SPLINT - PT @ 0815(1st) - based on 1045 OR time    Last Vitals:Temperature: 36.6 C (97.9 F) (08/16/21 1530)  Heart Rate: 71 (08/16/21 1500)  BP (Non-Invasive): 131/82 (08/16/21 1545)  Respiratory Rate: (!) 21 (08/16/21 1500)  SpO2: 95 % (08/16/21 1545)    No notable events documented.    Patient is sufficiently recovered from the effects of anesthesia to participate in the evaluation and has returned to their pre-procedure level.  Patient location during evaluation: PACU       Patient participation: complete - patient participated  Level of consciousness: awake and alert and responsive to verbal stimuli    Pain management: adequate  Airway patency: patent    Anesthetic complications: no  Cardiovascular status: acceptable  Respiratory status: acceptable  Hydration status: acceptable  Patient post-procedure temperature: Pt Normothermic   PONV Status: Absent

## 2021-08-31 ENCOUNTER — Ambulatory Visit: Payer: 59

## 2021-08-31 ENCOUNTER — Other Ambulatory Visit: Payer: Self-pay

## 2021-08-31 ENCOUNTER — Encounter (INDEPENDENT_AMBULATORY_CARE_PROVIDER_SITE_OTHER): Payer: Self-pay

## 2021-08-31 DIAGNOSIS — S86311D Strain of muscle(s) and tendon(s) of peroneal muscle group at lower leg level, right leg, subsequent encounter: Secondary | ICD-10-CM | POA: Insufficient documentation

## 2021-08-31 MED ORDER — APIXABAN 5 MG TABLET
5.0000 mg | ORAL_TABLET | Freq: Two times a day (BID) | ORAL | 0 refills | Status: AC
Start: 2021-08-31 — End: 2021-10-07

## 2021-08-31 NOTE — Progress Notes (Signed)
[place]  [place], [place] [address]  O: [phone]  F: [phone]         [facility]        Name: Eric Cannon MRN:  [id number]   Date: 08/31/2021 Age: 51 y.o.         Chief Complaint     Post Op       Date of Surgery: 08/16/2021    Surgery:   1- Tenolysis of peroneal tendon tenosynovitis, right  2- Tenodesis of peroneal brevis and longus tendons, right  3- excision of peroneal tubercle, right  4- application of short-leg splint, right      History of Present Illness      08/31/2021    Eric Cannon  is a 51 y.o. who returns to clinic today 2 Weeks post the above mentioned procedure.  Overall, He is doing well.  He is Non-Weightbearing in a postoperative splint.  He has been using a knee scooter to remain non-weightbearing. He has been taking Eliquis for DVT prophylaxis due to aspirin allergy. He has been taking Vitamin D 5000 units daily.  Pain is improving.  He denies fever and chills.        Historical Data    Past Medical/Surgical History     Past Medical History:   Diagnosis Date   . Cancer (CMS HCC)     skin cancer removed from right shoulder   . Esophageal reflux    . Essential hypertension    . Heart murmur     as a child   . Hyperlipidemia    . Wears glasses             Past Surgical History:   Procedure Laterality Date   . GALLBLADDER SURGERY     . HX APPENDECTOMY     . HX UPPER ENDOSCOPY     . HX WISDOM TEETH EXTRACTION     . SKIN CANCER EXCISION Right     right shoulder              Past Family and Social History     Family Medical History:    None           Social History     Socioeconomic History   . Marital status: Married     Spouse name: Not on file   . Number of children: Not on file   . Years of education: Not on file   . Highest education level: Not on file   Occupational History   . Not on file   Tobacco Use   . Smoking status: Never      Passive exposure: Never   . Smokeless tobacco: Former     Types: Snuff     Quit date: 2007   Vaping Use   . Vaping Use: Never used   Substance and Sexual Activity   . Alcohol use: Yes     Comment: weekends--3-4 shots of liquor   . Drug use: Never   . Sexual activity: Not on file   Other Topics Concern   . Ability to Walk 1 Flight of Steps without SOB/CP Yes   . Routine Exercise No   . Ability to Walk 2 Flight of Steps without SOB/CP Yes   . Unable to Ambulate No   . Total Care No   .  Ability To Do Own ADL's Yes   . Uses Walker No   . 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   . Uses Cane No   Social History Narrative   . Not on file     Social Determinants of Health     Financial Resource Strain: Not on file   Transportation Needs: Not on file   Social Connections: Not on file   Intimate Partner Violence: Not on file   Housing Stability: Not on file        Medications and Allergies     Home Medications:    Current Outpatient Medications   Medication Instructions   . apixaban (ELIQUIS) 5 mg, Oral, 2 TIMES DAILY   . atorvastatin (LIPITOR) 20 mg, Oral, NIGHTLY   . cetirizine HCl (CETIRIZINE ORAL) 10 mg, Oral, NIGHTLY   . cholecalciferol (vitamin D3) 5,000 Units, Oral, DAILY   . ergocalciferol, vitamin D2, (VITAMIN D2 ORAL) Oral, DAILY   . gabapentin (NEURONTIN) 600 mg Oral Tablet Take 1/2 Tablet (300 mg total) by mouth Every night   . glucosam-chon-msm1-C-mang (OSTEO BI-FLEX TRIPLE STRENGTH) 750 mg-644 mg- 30 mg-1 mg Oral Tablet 2 Tablets, Oral, DAILY   . hydroCHLOROthiazide (HYDRODIURIL) 25 mg, Oral, DAILY   . multivit with minerals/lutein (MULTIVITAMIN 50 PLUS ORAL) Oral, DAILY   . omega 3-dha-epa-fish oil 900 mg-360 mg- 455 mg-1,000 mg Oral Capsule Oral, DAILY   . omeprazole (PRILOSEC) 40 mg, Oral, DAILY   . oxyCODONE (ROXICODONE) 5 mg, Oral, EVERY 6 HOURS PRN   . Pain Relief ES (acetaminophen) 500 mg, Oral, EVERY 6 HOURS PRN   . Vitamin C 500 mg, Oral,  DAILY       Allergies:  Allergies   Allergen Reactions   . Adhesive      Swelling skin and rash    . Asprin Ec Low Dose [Aspirin] Swelling             Review of Systems     A 14-point comprehensive ROS is reviewed with the patient today by my CMA/myself, and remains unchanged from prior, as documented on the original encounter date, except for pertinent musculoskeletal elements identified above in the HPI.    This ROS includes the following systems: Constitutional, HEENT, Eyes, Respiratory, Cardiovascular, GastrointestinaI, Endocrine, Genitourinary, Musculoskeletal, Allergy/Immuno, Neurological, Hematologic, Behavorial, and Skin.      Physical Examination + Relevant Imaging/Labs/Other Studies     General:  Well-developed, no acute distress  Neurological:  Oriented to person, place, and time.  Psychological:  Normal mood and affect.  Eyes: Pupils equal, round with synchronous movement; sclera white  Respiratory: Non-labored breathing, symmetric chest wall rise      Focused Foot/Ankle (surgical side)  Gait: Stable wih assistive device (cane/walker/etc.) knee scooter    Neurovascular: Foot warm and well perfused.  2+ dorsalis pedis and posterior tibial pulses.  Brisk capillary refill. Sensation intact over the dorsal and plantar aspects of the foot.        Inspection/Skin: Incision healing well.  No erythema or drainage.  No indication of infection.  No wound dishiscence.     Tenderness: no tenderness to light touch    Range of Motion: deferred    Strength: deferred    Stability: deferred        Imaging   Radiographs:  No new imaging was obtained today.      Assessment and Plan  ICD-10-CM    1. Tear of peroneal tendon, right, subsequent encounter  S86.311D DME - CAM WALKER BOOT           Eric Cannon returns for His postoperative visit, 2 weeks status post right tenolysis peroneal tendons, tenodesis peroneus brevis and longus, excision peroneal tubercle.  Currently the incision lacks significant erythema, is  not draining, and does not appear to be infected.  We will remove the sutures today and apply steri strips to the incision.  He will be Non weight bearing in a CAM boot for the next 4 weeks.  He may come out of the boot for 1 hour each day to do gentle ankle ROM.  The patient should continue to take Eliquis for VTE prophylaxis.  He should continue Vitamin D supplementation.      - He will follow up: 4 wks    Questions and concerns have been invited/addressed and the patient has a good understanding of the diagnosis, treatment plan, goals/expectations, and overall prognosis.    Theone Murdoch, PA-C

## 2021-09-09 ENCOUNTER — Telehealth (INDEPENDENT_AMBULATORY_CARE_PROVIDER_SITE_OTHER): Payer: Self-pay | Admitting: Orthopaedic Surgery

## 2021-09-09 NOTE — Nursing Note (Signed)
Received short term disability form on 09/03/21.  Completed and faxed form back to [employer] @ [phone] on 09/09/21.  A copy will be sent for scanning and a copy will be mailed to the patient.     Ashley Mariner, RN

## 2021-09-23 ENCOUNTER — Encounter (INDEPENDENT_AMBULATORY_CARE_PROVIDER_SITE_OTHER): Payer: Self-pay | Admitting: Orthopaedic Surgery

## 2021-10-04 ENCOUNTER — Encounter (HOSPITAL_BASED_OUTPATIENT_CLINIC_OR_DEPARTMENT_OTHER): Payer: Self-pay

## 2021-10-07 ENCOUNTER — Encounter (INDEPENDENT_AMBULATORY_CARE_PROVIDER_SITE_OTHER): Payer: Self-pay

## 2021-10-07 ENCOUNTER — Other Ambulatory Visit: Payer: Self-pay

## 2021-10-07 ENCOUNTER — Ambulatory Visit: Payer: PRIVATE HEALTH INSURANCE | Admitting: Orthopaedic Surgery

## 2021-10-07 ENCOUNTER — Encounter (INDEPENDENT_AMBULATORY_CARE_PROVIDER_SITE_OTHER): Payer: Self-pay | Admitting: Orthopaedic Surgery

## 2021-10-07 DIAGNOSIS — S86311D Strain of muscle(s) and tendon(s) of peroneal muscle group at lower leg level, right leg, subsequent encounter: Secondary | ICD-10-CM | POA: Insufficient documentation

## 2021-10-07 DIAGNOSIS — Z09 Encounter for follow-up examination after completed treatment for conditions other than malignant neoplasm: Secondary | ICD-10-CM

## 2021-10-08 ENCOUNTER — Encounter (INDEPENDENT_AMBULATORY_CARE_PROVIDER_SITE_OTHER): Payer: Self-pay | Admitting: Orthopaedic Surgery

## 2021-10-08 NOTE — Progress Notes (Signed)
[place]  [place], [place] [address]  O: [phone]  F: [phone]         [facility]        Name: Eric Cannon MRN:  [id number]   Date: 10/07/2021 Age: 51 y.o.         Chief Complaint     Follow Up       Date of Surgery: 08/16/2021    Surgery:   1- Tenolysis of peroneal tendon tenosynovitis, right  2- Tenodesis of peroneal brevis and longus tendons, right  3- excision of peroneal tubercle, right  4- application of short-leg splint, right      History of Present Illness      08/31/2021    Eric Cannon  is a 51 y.o. who returns to clinic today 2 Weeks post the above mentioned procedure.  Overall, He is doing well.  He is Non-Weightbearing in a postoperative splint.  He has been using a knee scooter to remain non-weightbearing. He has been taking Eliquis for DVT prophylaxis due to aspirin allergy. He has been taking Vitamin D 5000 units daily.  Pain is improving.  He denies fever and chills.      10/07/2021  [name] is a very pleasant 51 y.o. male who returns to clinic today 7 weeks post the above mentioned procedure.  Overall, he is doing well and pain is minimal.  He has been non-weight bearing in a CAM boot.  He has been using a knee scooter to remain non-weight bearing.  He has been completing some ankle ROM exercises.  He has been taking Eliquis for DVT prophylaxis.  He has been taking Vitamin D 5000 units daily.      Historical Data    Past Medical/Surgical History     Past Medical History:   Diagnosis Date    Cancer (CMS [place])     skin cancer removed from right shoulder    Esophageal reflux     Essential hypertension     Heart murmur     as a child    Hyperlipidemia     Wears glasses             Past Surgical History:   Procedure Laterality Date    GALLBLADDER SURGERY      HX APPENDECTOMY      HX UPPER ENDOSCOPY      HX WISDOM TEETH EXTRACTION       SKIN CANCER EXCISION Right     right shoulder              Past Family and Social History     Family Medical History:    None           Social History     Socioeconomic History    Marital status: Married     Spouse name: Not on file    Number of children: Not on file    Years of education: Not on file    Highest education level: Not on file   Occupational History    Not on file   Tobacco Use    Smoking status: Never     Passive exposure: Never    Smokeless tobacco: Former     Types: Snuff     Quit date: 2007   Vaping Use    Vaping Use: Never used  Substance and Sexual Activity    Alcohol use: Yes     Comment: weekends--3-4 shots of liquor    Drug use: Never    Sexual activity: Not on file   Other Topics Concern    Ability to Walk 1 Flight of Steps without SOB/CP Yes    Routine Exercise No    Ability to Walk 2 Flight of Steps without SOB/CP Yes    Unable to Ambulate No    Total Care No    Ability To Do Own ADL's Yes    Uses Walker No    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   Social History Narrative    Not on file     Social Determinants of Health     Financial Resource Strain: Not on file   Transportation Needs: Not on file   Social Connections: Not on file   Intimate Partner Violence: Not on file   Housing Stability: Not on file        Medications and Allergies     Home Medications:    Current Outpatient Medications   Medication Instructions    atorvastatin (LIPITOR) 20 mg, Oral, NIGHTLY    cetirizine HCl (CETIRIZINE ORAL) 10 mg, Oral, NIGHTLY    cholecalciferol (vitamin D3) 5,000 Units, Oral, DAILY    ergocalciferol, vitamin D2, (VITAMIN D2 ORAL) Oral, DAILY    gabapentin (NEURONTIN) 600 mg Oral Tablet Take 1/2 Tablet (300 mg total) by mouth Every night    glucosam-chon-msm1-C-mang (OSTEO BI-FLEX TRIPLE STRENGTH) 750 mg-644 mg- 30 mg-1 mg Oral Tablet 2 Tablets, Oral, DAILY    hydroCHLOROthiazide (HYDRODIURIL) 25 mg, Oral, DAILY     multivit with minerals/lutein (MULTIVITAMIN 50 PLUS ORAL) Oral, DAILY    omega 3-dha-epa-fish oil 900 mg-360 mg- 455 mg-1,000 mg Oral Capsule Oral, DAILY    omeprazole (PRILOSEC) 40 mg, Oral, DAILY    oxyCODONE (ROXICODONE) 5 mg, Oral, EVERY 6 HOURS PRN    Pain Relief ES (acetaminophen) 500 mg, Oral, EVERY 6 HOURS PRN    Vitamin C 500 mg, Oral, DAILY       Allergies:  Allergies   Allergen Reactions    Adhesive      Swelling skin and rash     Asprin Ec Low Dose [Aspirin] Swelling             Review of Systems     A 14-point comprehensive ROS is reviewed with the patient today by my CMA/myself, and remains unchanged from prior, as documented on the original encounter date, except for pertinent musculoskeletal elements identified above in the HPI.    This ROS includes the following systems: Constitutional, HEENT, Eyes, Respiratory, Cardiovascular, GastrointestinaI, Endocrine, Genitourinary, Musculoskeletal, Allergy/Immuno, Neurological, Hematologic, Behavorial, and Skin.      Physical Examination + Relevant Imaging/Labs/Other Studies     General:  Well-developed, no acute distress  Neurological:  Oriented to person, place, and time.  Psychological:  Normal mood and affect.  Eyes: Pupils equal, round with synchronous movement; sclera white  Respiratory: Non-labored breathing, symmetric chest wall rise      Focused Foot/Ankle (surgical side)  Gait: Stable wih assistive device (cane/walker/etc.)  knee scooter    Neurovascular: Foot warm and well perfused.  2+ dorsalis pedis and posterior tibial pulses.  Brisk capillary refill. Sensation intact over the dorsal and plantar aspects of the foot.  Inspection/Skin: Incision well healed.  No erythema or drainage.  No indication of infection.  No wound dishiscence.     Tenderness: no tenderness to light touch    Range of Motion: able to plantar flex and dorsiflex the ankle, limited inversion and eversion    Strength: deferred    Stability: no instability        Imaging    Radiographs:  No new imaging was obtained today.      Assessment and Plan       ICD-10-CM    1. Postop check  Z09       2. Tear of peroneal tendon, right, subsequent encounter  S86.311D Refer to Physical Therapy-External           Eric Cannon returns for His postoperative visit, 7 weeks status post right tenolysis peroneal tendons, tenodesis peroneus brevis and longus, excision peroneal tubercle.  Currently the incision lacks significant erythema, is not draining, and does not appear to be infected.  He will be Weight bearing as tolerated in a CAM boot for the next 2 weeks.  He may use a walker as needed when he starts weightbearing.  Two weeks from now he may begin to gradually transition out of the boot by 2 hours less each day.  When not in the boot he should wear supportive stiff soled shoes with ASO brace as needed.  He should start physical therapy for ROM and strengthening.  He was given a script for this today.  He may discontinue Eliquis, as he will be regularly mobilizing.      - He will follow up:  5 wks    Questions and concerns have been invited/addressed and the patient has a good understanding of the diagnosis, treatment plan, goals/expectations, and overall prognosis.    Theone Murdoch, PA-C    I saw and examined the patient.  I directly supervised the PA's activities and procedures.  I reviewed the PA's note.  I agree with the findings and plan of care as documented in the PA's note.      Arlyss Repress, MD  Assistant Professor-   Foot & Ankle Surgery  [facility]

## 2021-10-11 ENCOUNTER — Telehealth (INDEPENDENT_AMBULATORY_CARE_PROVIDER_SITE_OTHER): Payer: Self-pay

## 2021-10-20 ENCOUNTER — Encounter (INDEPENDENT_AMBULATORY_CARE_PROVIDER_SITE_OTHER): Payer: Self-pay | Admitting: Orthopaedic Surgery

## 2021-10-25 NOTE — Addendum Note (Signed)
Addended by: Tera Helper on: 10/25/2021 04:51 PM     Modules accepted: Level of Service

## 2021-11-02 ENCOUNTER — Encounter (HOSPITAL_BASED_OUTPATIENT_CLINIC_OR_DEPARTMENT_OTHER): Payer: Self-pay

## 2021-11-09 ENCOUNTER — Ambulatory Visit: Payer: 59 | Attending: Orthopaedic Surgery | Admitting: Orthopaedic Surgery

## 2021-11-09 ENCOUNTER — Encounter (INDEPENDENT_AMBULATORY_CARE_PROVIDER_SITE_OTHER): Payer: Self-pay | Admitting: Orthopaedic Surgery

## 2021-11-09 ENCOUNTER — Other Ambulatory Visit: Payer: Self-pay

## 2021-11-09 DIAGNOSIS — Z09 Encounter for follow-up examination after completed treatment for conditions other than malignant neoplasm: Secondary | ICD-10-CM | POA: Insufficient documentation

## 2021-11-09 DIAGNOSIS — S86311D Strain of muscle(s) and tendon(s) of peroneal muscle group at lower leg level, right leg, subsequent encounter: Secondary | ICD-10-CM | POA: Insufficient documentation

## 2021-11-09 NOTE — Progress Notes (Signed)
[place]  [place], [place] [address]  O: [phone]  F: [phone]         [facility]        Name: Eric Cannon MRN:  [id number]   Date: 11/09/2021 Age: 51 y.o.         Chief Complaint     Follow Up       Date of Surgery: 08/16/2021    Surgery:   1- Tenolysis of peroneal tendon tenosynovitis, right  2- Tenodesis of peroneal brevis and longus tendons, right  3- excision of peroneal tubercle, right  4- application of short-leg splint, right      History of Present Illness      08/31/2021    Eric Cannon  is a 51 y.o. who returns to clinic today 2 Weeks post the above mentioned procedure.  Overall, He is doing well.  He is Non-Weightbearing in a postoperative splint.  He has been using a knee scooter to remain non-weightbearing. He has been taking Eliquis for DVT prophylaxis due to aspirin allergy. He has been taking Vitamin D 5000 units daily.  Pain is improving.  He denies fever and chills.      10/07/2021  Eric Cannon is a very pleasant 51 y.o. male who returns to clinic today 7 weeks post the above mentioned procedure.  Overall, he is doing well and pain is minimal.  He has been non-weight bearing in a CAM boot.  He has been using a knee scooter to remain non-weight bearing.  He has been completing some ankle ROM exercises.  He has been taking Eliquis for DVT prophylaxis.  He has been taking Vitamin D 5000 units daily.    11/09/2021  Eric Cannon is a very pleasant 51 y.o. male who returns to clinic today 3 months post the above mentioned procedure. Overall, he is doing really well. He has been compliant with physical therapy for 3 weeks so far. His pain has significantly improved but still experiences it on and off. He has been walking around the house. When he walks, he will sometimes roll his ankle on accident. He reports that the boot has been hurting  his knee so he will sometimes take it off and put on his brace. He denies any fever or chills. No new concerns at this time.      Historical Data    Past Medical/Surgical History     Past Medical History:   Diagnosis Date    Cancer (CMS [place])     skin cancer removed from right shoulder    Esophageal reflux     Essential hypertension     Heart murmur     as a child    Hyperlipidemia     Wears glasses             Past Surgical History:   Procedure Laterality Date    GALLBLADDER SURGERY      HX APPENDECTOMY      HX UPPER ENDOSCOPY      HX WISDOM TEETH EXTRACTION      SKIN CANCER EXCISION Right     right shoulder              Past Family and Social History     Family Medical History:    None  Social History     Socioeconomic History    Marital status: Married     Spouse name: Not on file    Number of children: Not on file    Years of education: Not on file    Highest education level: Not on file   Occupational History    Not on file   Tobacco Use    Smoking status: Never     Passive exposure: Never    Smokeless tobacco: Former     Types: Snuff     Quit date: 2007   Vaping Use    Vaping Use: Never used   Substance and Sexual Activity    Alcohol use: Yes     Comment: weekends--3-4 shots of liquor    Drug use: Never    Sexual activity: Not on file   Other Topics Concern    Ability to Walk 1 Flight of Steps without SOB/CP Yes    Routine Exercise No    Ability to Walk 2 Flight of Steps without SOB/CP Yes    Unable to Ambulate No    Total Care No    Ability To Do Own ADL's Yes    Uses Walker No    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   Social History Narrative    Not on file     Social Determinants of Health     Financial Resource Strain: Not on file   Transportation Needs: Not on file   Social Connections: Not on file   Intimate Partner Violence: Not on file   Housing Stability: Not on file        Medications and Allergies     Home  Medications:    Current Outpatient Medications   Medication Instructions    atorvastatin (LIPITOR) 20 mg, Oral, NIGHTLY    cetirizine HCl (CETIRIZINE ORAL) 10 mg, Oral, NIGHTLY    cholecalciferol (vitamin D3) 5,000 Units, Oral, DAILY    ergocalciferol, vitamin D2, (VITAMIN D2 ORAL) Oral, DAILY    gabapentin (NEURONTIN) 600 mg Oral Tablet Take 1/2 Tablet (300 mg total) by mouth Every night    glucosam-chon-msm1-C-mang (OSTEO BI-FLEX TRIPLE STRENGTH) 750 mg-644 mg- 30 mg-1 mg Oral Tablet 2 Tablets, Oral, DAILY    hydroCHLOROthiazide (HYDRODIURIL) 25 mg, Oral, DAILY    multivit with minerals/lutein (MULTIVITAMIN 50 PLUS ORAL) Oral, DAILY    omega 3-dha-epa-fish oil 900 mg-360 mg- 455 mg-1,000 mg Oral Capsule Oral, DAILY    omeprazole (PRILOSEC) 40 mg, Oral, DAILY    oxyCODONE (ROXICODONE) 5 mg, Oral, EVERY 6 HOURS PRN    Pain Relief ES (acetaminophen) 500 mg, Oral, EVERY 6 HOURS PRN    Vitamin C 500 mg, Oral, DAILY       Allergies:  Allergies   Allergen Reactions    Adhesive      Swelling skin and rash     Asprin Ec Low Dose [Aspirin] Swelling             Review of Systems     A 14-point comprehensive ROS is reviewed with the patient today by my CMA/myself, and remains unchanged from prior, as documented on the original encounter date, except for pertinent musculoskeletal elements identified above in the HPI.    This ROS includes the following systems: Constitutional, HEENT, Eyes, Respiratory, Cardiovascular, GastrointestinaI, Endocrine, Genitourinary, Musculoskeletal, Allergy/Immuno, Neurological, Hematologic, Behavorial, and Skin.  Physical Examination + Relevant Imaging/Labs/Other Studies     General:  Well-developed, no acute distress  Neurological:  Oriented to person, place, and time.  Psychological:  Normal mood and affect.  Eyes: Pupils equal, round with synchronous movement; sclera white  Respiratory: Non-labored breathing, symmetric chest wall rise      Focused Foot/Ankle (surgical side)  Gait: Stable wih  assistive device (cane/walker/etc.)  knee scooter    Neurovascular: Foot warm and well perfused.  2+ dorsalis pedis and posterior tibial pulses.  Brisk capillary refill. Sensation intact over the dorsal and plantar aspects of the foot.        Inspection/Skin: Incision well healed.  No erythema or drainage.  No indication of infection.  No wound dishiscence.     Tenderness: no tenderness to light touch    Range of Motion: able to plantar flex and dorsiflex the ankle, inversion and eversion has improved    Strength: no weakness on exam    Stability: no instability        Imaging   Radiographs:  No new imaging was obtained today.      Assessment and Plan       ICD-10-CM    1. Tear of peroneal tendon, right, subsequent encounter  S86.311D       2. Postop check  Z09            Eric Cannon is a very pleasant 51 y.o. male who returns to the clinic today 3 months post the above mentioned procedure. The patient reports great improvement in his right ankle. The patient is doing really well in supervised physical therapy. The patient is able to plantar flex, dorsiflex, and invert and evert his ankle with no pain or weakness in the ankle.I have recommended to continue with physical therapy for ROM and strengthening the peroneal tendons. The patient will continue to be out of work until October 23rd until he maximizes his strength and balance. The patient is back to his regular shoes. The patient is agreeable to the plan at this time.      Follow up: The patient will follow up in 3 months, sooner if needed.    I am scribing for, and in the presence of, Dr. Iris Pert for services provided on 11/09/2021.  [place], SCRIBE        Arlyss Repress, MD  Assistant Professor-   Foot & Ankle Surgery  [facility]

## 2021-11-19 ENCOUNTER — Encounter (INDEPENDENT_AMBULATORY_CARE_PROVIDER_SITE_OTHER): Payer: Self-pay | Admitting: Orthopaedic Surgery

## 2021-11-19 NOTE — Nursing Note (Signed)
Received FMLA on 11/09/21.  Completed and faxed form back to [employer] @ [phone] on 11/19/21.  A copy will be sent for scanning and a copy will be mailed to the patient.     Ashley Mariner, RN

## 2022-01-10 ENCOUNTER — Encounter (HOSPITAL_BASED_OUTPATIENT_CLINIC_OR_DEPARTMENT_OTHER): Payer: Self-pay

## 2022-01-11 ENCOUNTER — Ambulatory Visit (INDEPENDENT_AMBULATORY_CARE_PROVIDER_SITE_OTHER): Payer: Self-pay | Admitting: Orthopaedic Surgery

## 2022-01-18 ENCOUNTER — Encounter (INDEPENDENT_AMBULATORY_CARE_PROVIDER_SITE_OTHER): Payer: Self-pay

## 2022-01-18 ENCOUNTER — Ambulatory Visit: Payer: PRIVATE HEALTH INSURANCE | Attending: Orthopaedic Surgery | Admitting: Orthopaedic Surgery

## 2022-01-18 ENCOUNTER — Other Ambulatory Visit: Payer: Self-pay

## 2022-01-18 ENCOUNTER — Encounter (INDEPENDENT_AMBULATORY_CARE_PROVIDER_SITE_OTHER): Payer: Self-pay | Admitting: Orthopaedic Surgery

## 2022-01-18 DIAGNOSIS — S86311D Strain of muscle(s) and tendon(s) of peroneal muscle group at lower leg level, right leg, subsequent encounter: Secondary | ICD-10-CM | POA: Insufficient documentation

## 2022-01-18 DIAGNOSIS — Z09 Encounter for follow-up examination after completed treatment for conditions other than malignant neoplasm: Secondary | ICD-10-CM | POA: Insufficient documentation

## 2022-01-18 NOTE — Progress Notes (Signed)
[place]  [place], [place] [address]  O: [phone]  F: [phone]         [facility]        Name: Eric Cannon MRN:  [id number]   Date: 01/18/2022 Age: 51 y.o.         Chief Complaint     Post Op  Date of Surgery: 08/16/2021    Surgery:   1- Tenolysis of peroneal tendon tenosynovitis, right  2- Tenodesis of peroneal brevis and longus tendons, right  3- excision of peroneal tubercle, right  4- application of short-leg splint, right      History of Present Illness      08/31/2021    Eric Cannon  is a 51 y.o. who returns to clinic today 2 Weeks post the above mentioned procedure.  Overall, He is doing well.  He is Non-Weightbearing in a postoperative splint.  He has been using a knee scooter to remain non-weightbearing. He has been taking Eliquis for DVT prophylaxis due to aspirin allergy. He has been taking Vitamin D 5000 units daily.  Pain is improving.  He denies fever and chills.      10/07/2021  Eric Cannon is a very pleasant 51 y.o. male who returns to clinic today 7 weeks post the above mentioned procedure.  Overall, he is doing well and pain is minimal.  He has been non-weight bearing in a CAM boot.  He has been using a knee scooter to remain non-weight bearing.  He has been completing some ankle ROM exercises.  He has been taking Eliquis for DVT prophylaxis.  He has been taking Vitamin D 5000 units daily.    11/09/2021  Eric Cannon is a very pleasant 51 y.o. male who returns to clinic today 3 months post the above mentioned procedure. Overall, he is doing really well. He has been compliant with physical therapy for 3 weeks so far. His pain has significantly improved but still experiences it on and off. He has been walking around the house. When he walks, he will sometimes roll his ankle on accident. He reports that the boot has been hurting his  knee so he will sometimes take it off and put on his brace. He denies any fever or chills. No new concerns at this time.     01/18/22  Pt now 5 months out from above procedure. Doing well. Has no ankle pain, but does have burning pain in his lateral foot from time to time. Has finished PT and returned to work which he is tolerating well. Wears boots most of the time as this helps with his gait. No complaints or issues today.      Historical Data    Past Medical/Surgical History     Past Medical History:   Diagnosis Date    Cancer ([facility] [place])     skin cancer removed from right shoulder    Esophageal reflux     Essential hypertension     Heart murmur     as a child    Hyperlipidemia     Wears glasses             Past Surgical History:   Procedure Laterality Date    GALLBLADDER SURGERY      HX APPENDECTOMY      HX UPPER ENDOSCOPY  HX WISDOM TEETH EXTRACTION      SKIN CANCER EXCISION Right     right shoulder              Past Family and Social History     Family Medical History:    None           Social History     Socioeconomic History    Marital status: Married     Spouse name: Not on file    Number of children: Not on file    Years of education: Not on file    Highest education level: Not on file   Occupational History    Not on file   Tobacco Use    Smoking status: Never     Passive exposure: Never    Smokeless tobacco: Former     Types: Snuff     Quit date: 2007   Vaping Use    Vaping Use: Never used   Substance and Sexual Activity    Alcohol use: Yes     Comment: weekends--3-4 shots of liquor    Drug use: Never    Sexual activity: Not on file   Other Topics Concern    Ability to Walk 1 Flight of Steps without SOB/CP Yes    Routine Exercise No    Ability to Walk 2 Flight of Steps without SOB/CP Yes    Unable to Ambulate No    Total Care No    Ability To Do Own ADL's Yes    Uses Walker No    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    Social History Narrative    Not on file     Social Determinants of Health     Financial Resource Strain: Not on file   Transportation Needs: Not on file   Social Connections: Not on file   Intimate Partner Violence: Not on file   Housing Stability: Not on file        Medications and Allergies     Home Medications:    Current Outpatient Medications   Medication Instructions    atorvastatin (LIPITOR) 20 mg, Oral, NIGHTLY    cetirizine HCl (CETIRIZINE ORAL) 10 mg, Oral, NIGHTLY    cholecalciferol (vitamin D3) 5,000 Units, Oral, DAILY    ergocalciferol, vitamin D2, (VITAMIN D2 ORAL) Oral, DAILY    gabapentin (NEURONTIN) 600 mg Oral Tablet Take 1/2 Tablet (300 mg total) by mouth Every night    glucosam-chon-msm1-C-mang (OSTEO BI-FLEX TRIPLE STRENGTH) 750 mg-644 mg- 30 mg-1 mg Oral Tablet 2 Tablets, Oral, DAILY    hydroCHLOROthiazide (HYDRODIURIL) 25 mg, Oral, DAILY    multivit with minerals/lutein (MULTIVITAMIN 50 PLUS ORAL) Oral, DAILY    omega 3-dha-epa-fish oil 900 mg-360 mg- 455 mg-1,000 mg Oral Capsule Oral, DAILY    omeprazole (PRILOSEC) 40 mg, Oral, DAILY    oxyCODONE (ROXICODONE) 5 mg, Oral, EVERY 6 HOURS PRN    Pain Relief ES (acetaminophen) 500 mg, Oral, EVERY 6 HOURS PRN    Vitamin C 500 mg, Oral, DAILY       Allergies:  Allergies   Allergen Reactions    Adhesive      Swelling skin and rash     Asprin Ec Low Dose [Aspirin] Swelling             Review of Systems     A 14-point comprehensive ROS is  reviewed with the patient today by my CMA/myself, and remains unchanged from prior, as documented on the original encounter date, except for pertinent musculoskeletal elements identified above in the HPI.    This ROS includes the following systems: Constitutional, HEENT, Eyes, Respiratory, Cardiovascular, GastrointestinaI, Endocrine, Genitourinary, Musculoskeletal, Allergy/Immuno, Neurological, Hematologic, Behavorial, and Skin.      Physical Examination + Relevant Imaging/Labs/Other Studies     General:   Well-developed, no acute distress  Neurological:  Oriented to person, place, and time.  Psychological:  Normal mood and affect.  Eyes: Pupils equal, round with synchronous movement; sclera white  Respiratory: Non-labored breathing, symmetric chest wall rise     RIGHT LEFT   GAIT: Normal Normal        STANDING ALIGNMENT:     Ankle/Hindfoot/Midfoot Deformity none acute none acute   Forefoot Deformity none none        SEATED EXAM:     Skin incision well healed normal   Nails normal- no clubbing or nail changes normal- no clubbing or nail changes   Effusion none none   Swelling none none   Warmth No warmth No warmth   Crepitation none none   Tenderness Ankle - none  Foot - none  Toes - none Ankle - none  Foot - none  Toes - none        ROM:     Ankle Dorsiflexion 20 degrees, Painless 20 degrees, Painless   Ankle Plantarflexion 40 degrees, Painless 40 degrees, Painless   Subtalar Joint Inversion 30 degrees, Painless 30 degrees, Painless   Subtalar Joint Eversion 15 degrees, Painless 15 degrees, Painless   Transverse Tarsal Joints Abduction Painless Painless   Transverse Tarsal Joints Adduction Painless Painless   1st MTP Dorsiflexion 70 degrees, Painless 70 degrees, Painless   1st MTP Plantarflexion 35 degrees, Painless 35 degrees, Painless   Lesser Toe MTP's 2-5 Full ROM and Painless Full ROM and Painless        STRENGTH:     Atrophy none none   Abnormal Movements none none   Anterior Tibial Muscle 5/5 5/5   Posterior Tibial Muscle 5/5 5/5   Peroneals 5/5 5/5   Gastroc-soleus Muscle 5/5 5/5   Single Heel Raise Able Able        STABILITY: No instability No instability        SENSATION:     Sural Nerve Dist. hypoesthesia with positive tinnel intact   Saphenous New Dist. intact intact   Deep Peroneal Nerve Dist. intact intact   Superficial Peroneal Nerve Dist. intact intact        VASCULAR EXAM:     Varicosities none none   Capillary refill < 3 seconds/normal < 3 seconds/normal   DP Artery pulse Palpable Palpable   PT  Artery pulse Palpable Palpable          Imaging   Radiographs:  No new imaging was obtained today.      Assessment and Plan       ICD-10-CM    1. Tear of peroneal tendon, right, subsequent encounter  S86.311D       2. Postop check  Z09         Jaiden Dinkins is a very pleasant 51 y.o. male who returns to the clinic today 5 months post the above mentioned procedure. The patient reports great improvement in his right ankle. He is back to work which he is tolerating well. He has not been doing much exercise since going back to work, but  he states he will doing his exercises at home even though he can't make it to PT. We will give him a PT rx for nerve massage and nerve glide that he can learn to do at home. He has no activity restrictions.       Follow up: The patient will follow up in 7 months, sooner if needed.    Sula Soda, MD    I saw and examined the patient.  I directly supervised the resident's activities and procedures.  I reviewed the resident's note.  I agree with the findings and plan of care as documented in the resident's note.    Arlyss Repress, MD  Assistant Professor-   Foot & Ankle Surgery  [facility] of [facility]

## 2022-07-26 ENCOUNTER — Ambulatory Visit (INDEPENDENT_AMBULATORY_CARE_PROVIDER_SITE_OTHER): Payer: Self-pay | Admitting: Orthopaedic Surgery

## 2022-12-20 IMAGING — MR MRI KNEE RT W/O CONTRAST
5 series · 40 of 40 positions shown · IV contrast (gadolinium)
Comparison: None available.

﻿EXAM:  [id number]   MRI KNEE RT W/O CONTRAST
INDICATION: 52-year-old with persistent right knee pain and swelling for about 1 month.  No history of trauma or previous surgery.  Internal derangement suspected..
TECHNIQUE: Multiplanar multisequential MRI of the right knee was performed without gadolinium contrast.

[Series 5: PD fat-sat · axial · right · 5.0mm · 0.53mm/px · z∈[-81,+78]mm · 8 of 30 slices shown (1 of 3)]
[im 1/30]
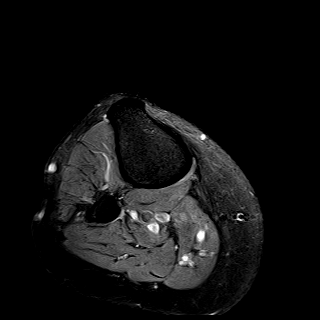
[im 5/30]
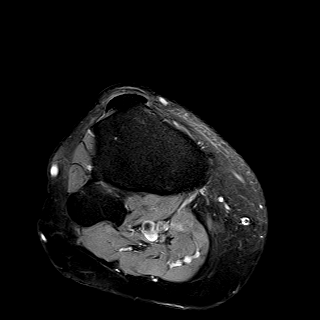
[im 9/30]
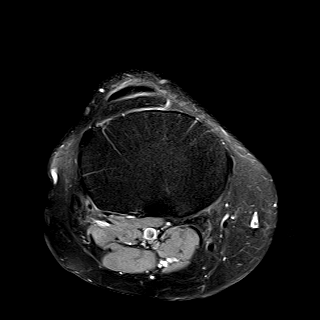
[im 13/30]
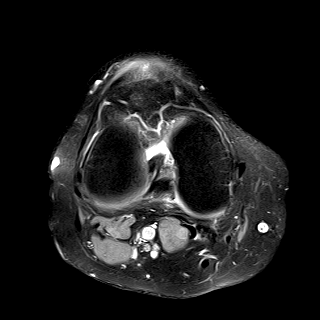
[im 17/30]
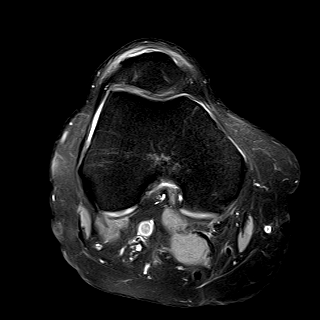
[im 21/30]
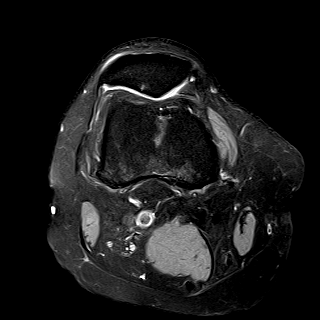
[im 25/30]
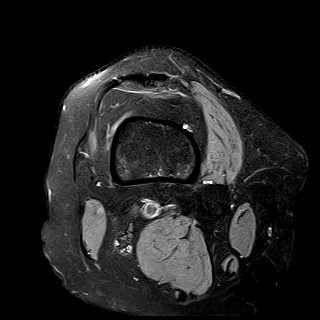
[im 30/30]
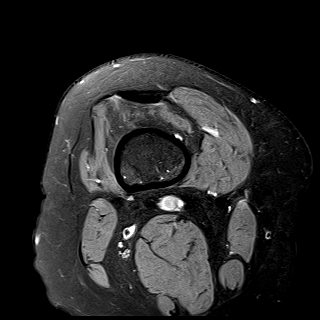

[Series 6: PD fat-sat · sagittal · right · 3.5mm · 0.53mm/px · 9 of 31 slices shown (2 of 3)]
[im 1/31]
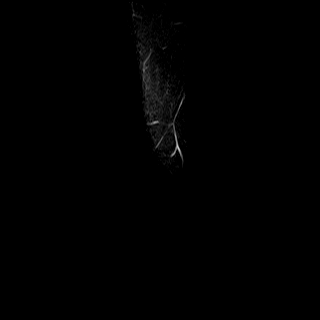
[im 4/31]
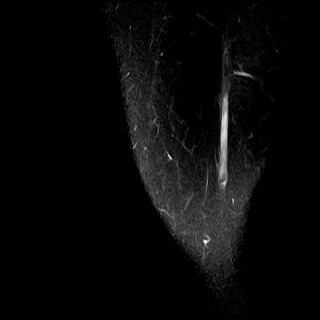
[im 8/31]
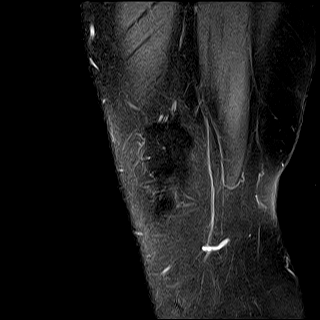
[im 12/31]
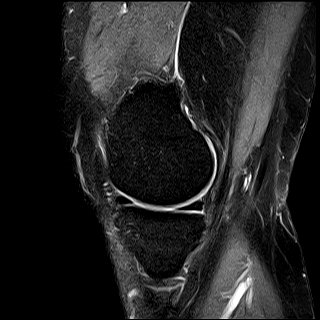
[im 16/31]
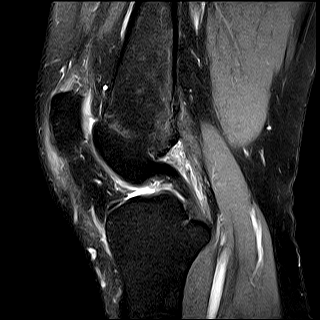
[im 19/31]
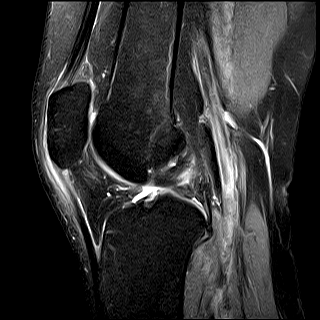
[im 23/31]
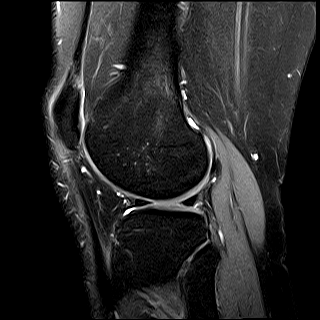
[im 27/31]
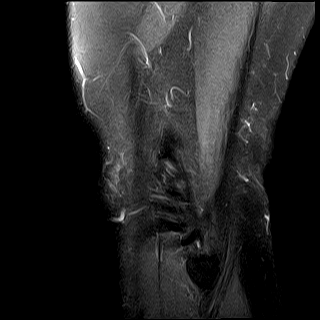
[im 31/31]
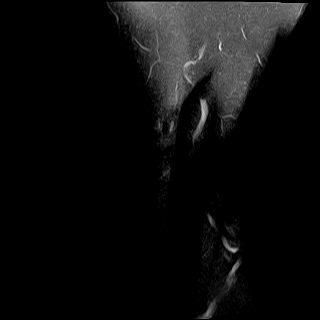

[Series 7: T1 · sagittal · right · 3.5mm · 0.44mm/px · 9 of 31 slices shown]
[im 1/31]
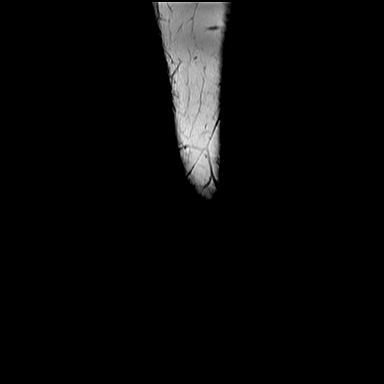
[im 4/31]
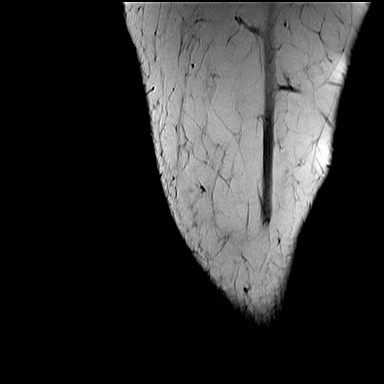
[im 8/31]
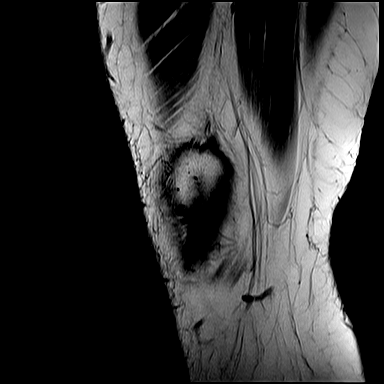
[im 12/31]
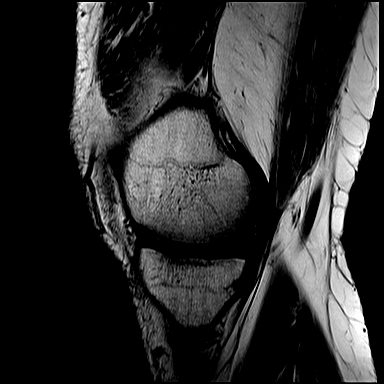
[im 16/31]
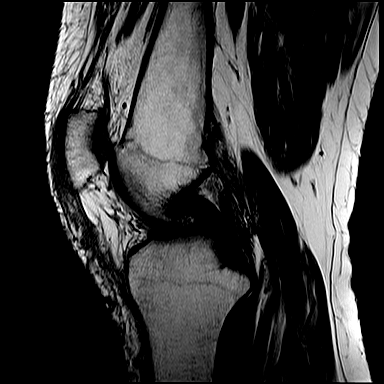
[im 19/31]
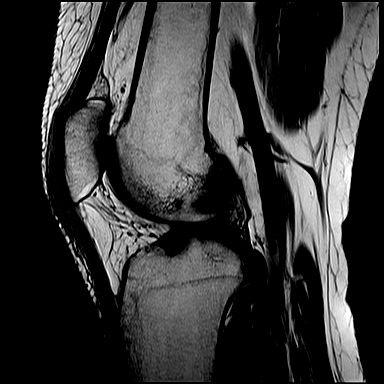
[im 23/31]
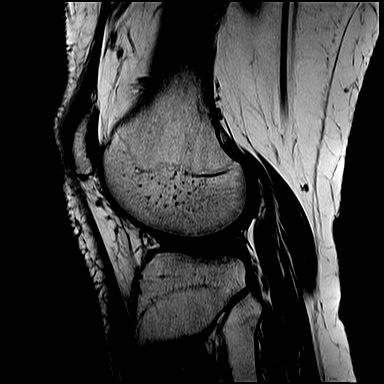
[im 27/31]
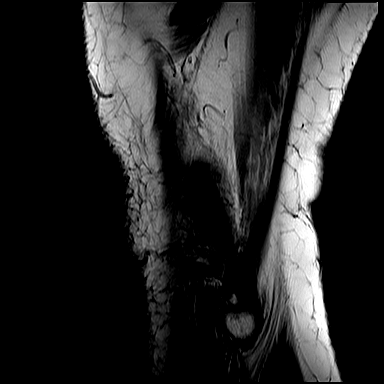
[im 31/31]
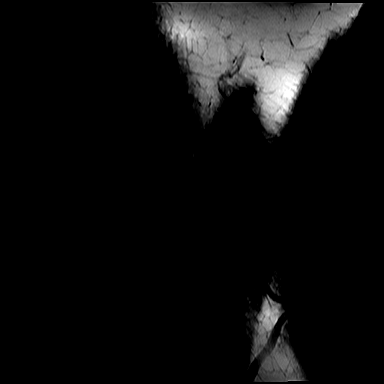

[Series 8: STIR · coronal · right · 5.5mm · 0.56mm/px · 7 of 23 slices shown]
[im 1/23]
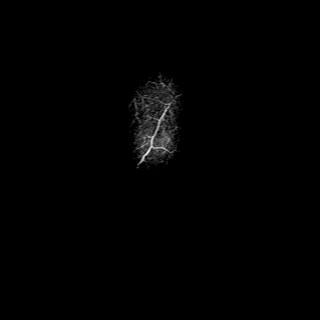
[im 4/23]
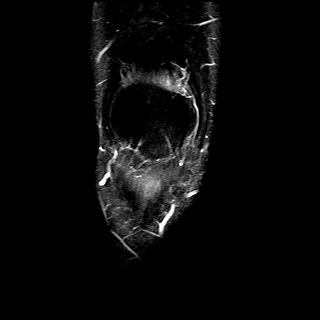
[im 8/23]
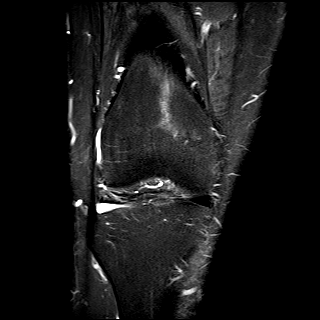
[im 12/23]
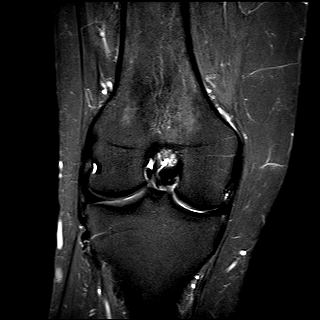
[im 15/23]
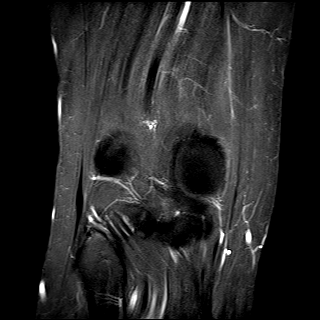
[im 19/23]
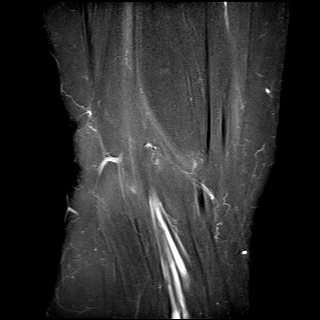
[im 23/23]
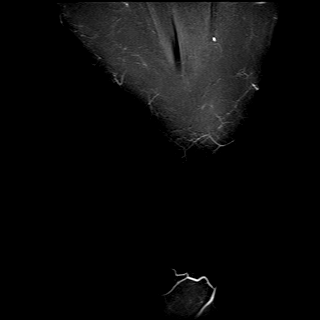

[Series 9: PD fat-sat · coronal · right · 5.5mm · 0.56mm/px · 7 of 23 slices shown (3 of 3)]
[im 1/23]
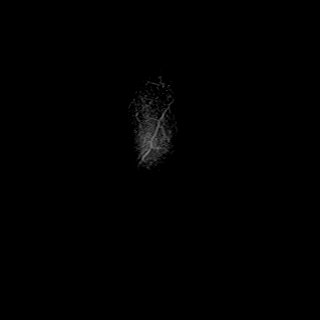
[im 4/23]
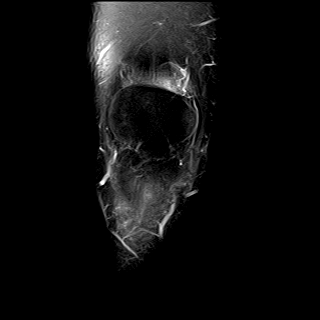
[im 8/23]
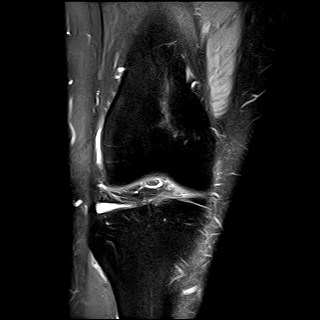
[im 12/23]
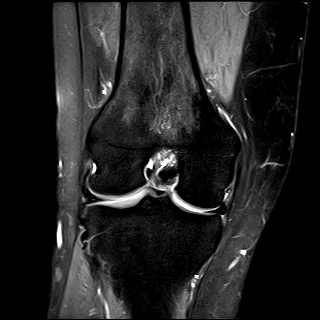
[im 15/23]
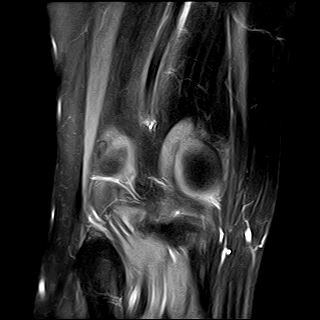
[im 19/23]
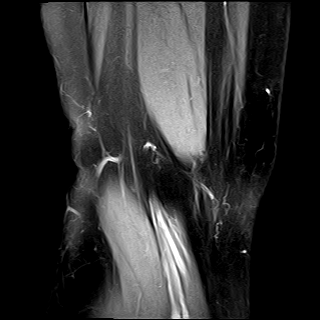
[im 23/23]
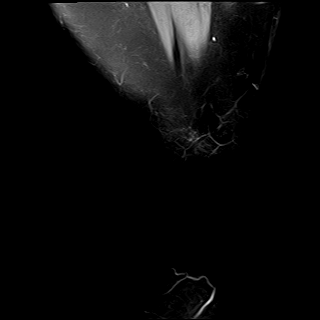

[40 of 40 positions shown; findings below may reference images not displayed]

FINDINGS: No acute bony lesions are seen on both sides of the knee joint. 

Lateral meniscus and lateral articular cartilage are intact. The anterior and posterior cruciate ligaments are intact.

No acute findings of medial meniscus and medial articular cartilage.  Medial and lateral collateral ligaments are intact. 

Significant increased signal within the proximal 3rd of the patellar tendon is noted suggestive of significant patellar tendinitis and/or partial-thickness tear.  No evidence of full-thickness disruption of the patellar tendon is seen.  Quadriceps tendon is intact.  Mild degenerative changes of patellar articular cartilage.

Soft tissues of the popliteal fossa are unremarkable.
IMPRESSION: 1. No acute bony lesions of the right knee.

2. Significant increased signal within the proximal 3rd of the patellar tendon is noted suggestive of significant patellar tendinitis and/or partial-thickness tear.  No evidence of full-thickness disruption of the patellar tendon is seen.  Quadriceps tendon is intact.  Mild degenerative changes of patellar articular cartilage.

3. Remaining findings are within normal limits.

Electronically Signed by BEELANG, ZIYAAD TERRENCE at [id number] [DATE]
